# Patient Record
Sex: Male | Born: 1955 | Race: White | Hispanic: No | Marital: Single | Smoking: Never smoker
Health system: Southern US, Community
[De-identification: ages and names within clinical notes are randomized; demographics above are authoritative.]

## PROBLEM LIST (undated history)

## (undated) DIAGNOSIS — F1910 Other psychoactive substance abuse, uncomplicated: Secondary | ICD-10-CM

## (undated) DIAGNOSIS — K279 Peptic ulcer, site unspecified, unspecified as acute or chronic, without hemorrhage or perforation: Secondary | ICD-10-CM

## (undated) DIAGNOSIS — F1920 Other psychoactive substance dependence, uncomplicated: Secondary | ICD-10-CM

## (undated) DIAGNOSIS — L98499 Non-pressure chronic ulcer of skin of other sites with unspecified severity: Secondary | ICD-10-CM

---

## 2014-01-26 ENCOUNTER — Emergency Department: Payer: Self-pay | Admitting: Emergency Medicine

## 2014-01-26 LAB — CBC WITH DIFFERENTIAL/PLATELET
Basophil #: 0 10*3/uL (ref 0.0–0.1)
Basophil %: 0.7 %
Eosinophil #: 0.3 10*3/uL (ref 0.0–0.7)
Eosinophil %: 4.4 %
HCT: 40.8 % (ref 40.0–52.0)
HGB: 13.4 g/dL (ref 13.0–18.0)
Lymphocyte #: 1.8 10*3/uL (ref 1.0–3.6)
Lymphocyte %: 31.7 %
MCH: 31.6 pg (ref 26.0–34.0)
MCHC: 32.9 g/dL (ref 32.0–36.0)
MCV: 96 fL (ref 80–100)
Monocyte #: 0.8 x10 3/mm (ref 0.2–1.0)
Monocyte %: 14 %
NEUTROS ABS: 2.8 10*3/uL (ref 1.4–6.5)
NEUTROS PCT: 49.2 %
Platelet: 242 10*3/uL (ref 150–440)
RBC: 4.25 10*6/uL — ABNORMAL LOW (ref 4.40–5.90)
RDW: 15.6 % — ABNORMAL HIGH (ref 11.5–14.5)
WBC: 5.8 10*3/uL (ref 3.8–10.6)

## 2014-01-26 LAB — COMPREHENSIVE METABOLIC PANEL
ANION GAP: 8 (ref 7–16)
Albumin: 3.3 g/dL — ABNORMAL LOW (ref 3.4–5.0)
Alkaline Phosphatase: 52 U/L
BUN: 7 mg/dL (ref 7–18)
Bilirubin,Total: 0.3 mg/dL (ref 0.2–1.0)
CHLORIDE: 107 mmol/L (ref 98–107)
CO2: 24 mmol/L (ref 21–32)
Calcium, Total: 7.9 mg/dL — ABNORMAL LOW (ref 8.5–10.1)
Creatinine: 0.78 mg/dL (ref 0.60–1.30)
EGFR (African American): >60
EGFR (Non-African Amer.): >60
Glucose: 78 mg/dL (ref 65–99)
OSMOLALITY: 274 (ref 275–301)
POTASSIUM: 4.1 mmol/L (ref 3.5–5.1)
SGOT(AST): 17 U/L (ref 15–37)
SGPT (ALT): 22 U/L
Sodium: 139 mmol/L (ref 136–145)
Total Protein: 6.7 g/dL (ref 6.4–8.2)

## 2014-01-26 LAB — URINALYSIS, COMPLETE
BILIRUBIN, UR: NEGATIVE
Bacteria: NONE SEEN
Blood: NEGATIVE
Glucose,UR: NEGATIVE mg/dL (ref 0–75)
KETONE: NEGATIVE
LEUKOCYTE ESTERASE: NEGATIVE
Nitrite: NEGATIVE
Ph: 5 (ref 4.5–8.0)
Protein: NEGATIVE
RBC, UR: NONE SEEN /HPF (ref 0–5)
Specific Gravity: 1.008 (ref 1.003–1.030)
Squamous Epithelial: NONE SEEN
WBC UR: NONE SEEN /HPF (ref 0–5)

## 2014-01-26 LAB — TROPONIN I

## 2014-01-26 LAB — LIPASE, BLOOD: LIPASE: 369 U/L (ref 73–393)

## 2014-03-23 ENCOUNTER — Encounter (HOSPITAL_BASED_OUTPATIENT_CLINIC_OR_DEPARTMENT_OTHER): Payer: Self-pay | Admitting: *Deleted

## 2014-03-23 ENCOUNTER — Emergency Department (HOSPITAL_BASED_OUTPATIENT_CLINIC_OR_DEPARTMENT_OTHER)
Admission: EM | Admit: 2014-03-23 | Discharge: 2014-03-23 | Disposition: A | Discharge status: Home / Self Care | Payer: Medicaid Other | Attending: Emergency Medicine | Admitting: Emergency Medicine

## 2014-03-23 DIAGNOSIS — Z9114 Patient's other noncompliance with medication regimen: Secondary | ICD-10-CM | POA: Diagnosis not present

## 2014-03-23 DIAGNOSIS — Z872 Personal history of diseases of the skin and subcutaneous tissue: Secondary | ICD-10-CM | POA: Diagnosis not present

## 2014-03-23 DIAGNOSIS — R1013 Epigastric pain: Secondary | ICD-10-CM

## 2014-03-23 HISTORY — DX: Non-pressure chronic ulcer of skin of other sites with unspecified severity: L98.499

## 2014-03-23 LAB — COMPREHENSIVE METABOLIC PANEL
ALT: 19 U/L (ref 0–53)
ANION GAP: 7 (ref 5–15)
AST: 18 U/L (ref 0–37)
Albumin: 3.9 g/dL (ref 3.5–5.2)
Alkaline Phosphatase: 39 U/L (ref 39–117)
BILIRUBIN TOTAL: 0.5 mg/dL (ref 0.3–1.2)
BUN: 11 mg/dL (ref 6–23)
CO2: 27 mmol/L (ref 19–32)
Calcium: 8.9 mg/dL (ref 8.4–10.5)
Chloride: 104 mmol/L (ref 96–112)
Creatinine, Ser: 0.83 mg/dL (ref 0.50–1.35)
GFR calc Af Amer: >90 mL/min (ref >90)
GFR calc non Af Amer: >90 mL/min (ref >90)
GLUCOSE: 105 mg/dL — AB (ref 70–99)
POTASSIUM: 4.1 mmol/L (ref 3.5–5.1)
SODIUM: 138 mmol/L (ref 135–145)
TOTAL PROTEIN: 6.6 g/dL (ref 6.0–8.3)

## 2014-03-23 MED ORDER — CALCIUM CARBONATE ANTACID 600 MG PO CHEW: 600.0000 mg | CHEWABLE_TABLET | ORAL | Status: AC | PRN

## 2014-03-23 MED ORDER — GI COCKTAIL ~~LOC~~
30.0000 mL | Freq: Once | ORAL | Status: AC
  Administered 2014-03-23: 30 mL via ORAL
  Filled 2014-03-23: qty 30

## 2014-03-23 MED ORDER — RANITIDINE HCL 150 MG PO TABS: 150.0000 mg | ORAL_TABLET | Freq: Two times a day (BID) | ORAL | Status: AC

## 2014-03-23 NOTE — ED Notes (Signed)
Patient here with abdominal burning and reports that he has noticed some blood in his stools. Has not taken zantac the last several days for his "bleeding ulcers".

## 2014-03-23 NOTE — Discharge Instructions (Signed)
As discussed, your evaluation today has been largely reassuring.  But, it is important that you monitor your condition carefully, and do not hesitate to return to the ED if you develop new, or concerning changes in your condition.  The single most important thing you can do is to continue taking all medication as directed, and to avoid using alcohol or caffeine, or other food/drug that irritates your stomach.  Otherwise, please follow-up with your physician for appropriate ongoing care.

## 2014-03-23 NOTE — ED Provider Notes (Signed)
CSN: [id number]     Arrival date & time 03/23/14  1020 History   First MD Initiated Contact with Patient 03/23/14 1036     Chief Complaint  Patient presents with  . Abdominal Pain     (Consider location/radiation/quality/duration/timing/severity/associated sxs/prior Treatment) HPI Patient presents from his inpatient treatment facility with concern of ongoing epigastric discomfort. Patient states that the pain has been present for several days, not appreciably worse today than in the past few. Pain is epigastric, nonradiating, sore, severe, worse after drinking coffee or soda. Patient also complains of possible blood in stool. He denies any syncope, syncope, weakness, chest pain, dyspnea or other focal changes from baseline. Patient has previously diagnosed ulcers, has required surgery in the past.  Patient is not taking his prescribed H2 blocking medication. No alcohol intake in about 2 months.  Past Medical History  Diagnosis Date  . Ulcer of abdomen wall    History reviewed. No pertinent past surgical history. No family history on file. History  Substance Use Topics  . Smoking status: Never Smoker   . Smokeless tobacco: Not on file  . Alcohol Use: Not on file    Review of Systems  Constitutional:       Per HPI, otherwise negative  HENT:       Per HPI, otherwise negative  Respiratory:       Per HPI, otherwise negative  Cardiovascular:       Per HPI, otherwise negative  Gastrointestinal: Negative for vomiting.  Endocrine:       Negative aside from HPI  Genitourinary:       Neg aside from HPI   Musculoskeletal:       Per HPI, otherwise negative  Skin: Negative.   Neurological: Negative for syncope.      Allergies  Codeine  Home Medications   Prior to Admission medications   Medication Sig Start Date End Date Taking? Authorizing Provider  lithium carbonate 150 MG capsule Take 150 mg by mouth every morning.   Yes Historical Provider, MD   BP 109/75 mmHg   Pulse 81  Temp(Src) 98.4 F (36.9 C) (Oral)  Resp 20  Ht 6\' 1"  (1.854 m)  Wt 155 lb (70.308 kg)  BMI 20.45 kg/m2  SpO2 99% Physical Exam  Constitutional: He is oriented to person, place, and time. He appears well-developed. No distress.  HENT:  Head: Normocephalic and atraumatic.  Eyes: Conjunctivae and EOM are normal.  Cardiovascular: Normal rate and regular rhythm.   Pulmonary/Chest: Effort normal. No stridor. No respiratory distress.  Abdominal: He exhibits no distension.  Soft non-peritoneal abdomen, with only minimal discomfort with deep palpation of the epigastrium.  Musculoskeletal: He exhibits no edema.  Neurological: He is alert and oriented to person, place, and time.  Skin: Skin is warm and dry.  Psychiatric: He has a normal mood and affect.  Nursing note and vitals reviewed.   ED Course  Procedures (including critical care time) Labs Review Labs Reviewed  COMPREHENSIVE METABOLIC PANEL   I reviewed the labs  MDM   Patient presents with ongoing epigastric pain, medication noncompliance, and intake of multiple liquids all known to be irritants of the gastric lining. Patient is hemodynamically stable, has unremarkable labs. Patient was restarted on his upper medication for gastric irritation, and absent acute findings, discharged to follow up with gastrin neurology as an outpatient.    Gerhard Munchobert Jaquari Reckner, MD 03/23/14 1209

## 2016-11-02 ENCOUNTER — Encounter (HOSPITAL_COMMUNITY): Payer: Self-pay

## 2016-11-02 ENCOUNTER — Emergency Department (HOSPITAL_COMMUNITY)
Admission: EM | Admit: 2016-11-02 | Discharge: 2016-11-02 | Disposition: A | Discharge status: Home / Self Care | Payer: Medicaid Other | Attending: Emergency Medicine | Admitting: Emergency Medicine

## 2016-11-02 DIAGNOSIS — K920 Hematemesis: Secondary | ICD-10-CM | POA: Diagnosis not present

## 2016-11-02 DIAGNOSIS — R112 Nausea with vomiting, unspecified: Secondary | ICD-10-CM | POA: Insufficient documentation

## 2016-11-02 DIAGNOSIS — F1910 Other psychoactive substance abuse, uncomplicated: Secondary | ICD-10-CM | POA: Diagnosis not present

## 2016-11-02 DIAGNOSIS — R319 Hematuria, unspecified: Secondary | ICD-10-CM | POA: Diagnosis not present

## 2016-11-02 DIAGNOSIS — R45851 Suicidal ideations: Secondary | ICD-10-CM | POA: Diagnosis not present

## 2016-11-02 LAB — URINALYSIS, ROUTINE W REFLEX MICROSCOPIC
Bilirubin Urine: NEGATIVE
Glucose, UA: NEGATIVE mg/dL
KETONES UR: NEGATIVE mg/dL
LEUKOCYTES UA: NEGATIVE
Nitrite: NEGATIVE
PROTEIN: NEGATIVE mg/dL
RBC / HPF: NONE SEEN RBC/hpf (ref 0–5)
Specific Gravity, Urine: 1.01 (ref 1.005–1.030)
Squamous Epithelial / LPF: NONE SEEN
pH: 5 (ref 5.0–8.0)

## 2016-11-02 LAB — COMPREHENSIVE METABOLIC PANEL
ALT: 25 U/L (ref 17–63)
ANION GAP: 10 (ref 5–15)
AST: 34 U/L (ref 15–41)
Albumin: 4.2 g/dL (ref 3.5–5.0)
Alkaline Phosphatase: 53 U/L (ref 38–126)
BILIRUBIN TOTAL: 0.2 mg/dL — AB (ref 0.3–1.2)
BUN: 9 mg/dL (ref 6–20)
CO2: 25 mmol/L (ref 22–32)
Calcium: 8.8 mg/dL — ABNORMAL LOW (ref 8.9–10.3)
Chloride: 108 mmol/L (ref 101–111)
Creatinine, Ser: 0.83 mg/dL (ref 0.61–1.24)
GFR calc Af Amer: >60 mL/min (ref >60)
GFR calc non Af Amer: >60 mL/min (ref >60)
GLUCOSE: 101 mg/dL — AB (ref 65–99)
POTASSIUM: 4 mmol/L (ref 3.5–5.1)
Sodium: 143 mmol/L (ref 135–145)
TOTAL PROTEIN: 8.1 g/dL (ref 6.5–8.1)

## 2016-11-02 LAB — POC OCCULT BLOOD, ED: Fecal Occult Bld: NEGATIVE

## 2016-11-02 LAB — CBC
HEMATOCRIT: 42.3 % (ref 39.0–52.0)
HEMOGLOBIN: 14.8 g/dL (ref 13.0–17.0)
MCH: 31.6 pg (ref 26.0–34.0)
MCHC: 35 g/dL (ref 30.0–36.0)
MCV: 90.2 fL (ref 78.0–100.0)
Platelets: 381 10*3/uL (ref 150–400)
RBC: 4.69 MIL/uL (ref 4.22–5.81)
RDW: 14.4 % (ref 11.5–15.5)
WBC: 7.5 10*3/uL (ref 4.0–10.5)

## 2016-11-02 LAB — LIPASE, BLOOD: Lipase: 33 U/L (ref 11–51)

## 2016-11-02 LAB — PROTIME-INR
INR: 0.91
Prothrombin Time: 12.2 seconds (ref 11.4–15.2)

## 2016-11-02 MED ORDER — ONDANSETRON HCL 4 MG/2ML IJ SOLN
4.0000 mg | Freq: Once | INTRAMUSCULAR | Status: AC
  Administered 2016-11-02: 4 mg via INTRAVENOUS
  Filled 2016-11-02: qty 2

## 2016-11-02 MED ORDER — SODIUM CHLORIDE 0.9 % IV BOLUS (SEPSIS)
1000.0000 mL | Freq: Once | INTRAVENOUS | Status: AC
  Administered 2016-11-02: 1000 mL via INTRAVENOUS

## 2016-11-02 MED ORDER — ONDANSETRON 8 MG PO TBDP: 8.0000 mg | ORAL_TABLET | Freq: Three times a day (TID) | ORAL | 0 refills | Status: AC | PRN

## 2016-11-02 MED ORDER — PANTOPRAZOLE SODIUM 40 MG IV SOLR
40.0000 mg | Freq: Once | INTRAVENOUS | Status: AC
  Administered 2016-11-02: 40 mg via INTRAVENOUS
  Filled 2016-11-02: qty 40

## 2016-11-02 NOTE — ED Triage Notes (Signed)
Pt reports hematemesis, hematuria, and hematochezia since yesterday. Per EMS, pt is in a drug rehab called [facility]," when asked to elaborate, pt states, "I cannot read or write." He endorses smoking crack and drinking yesterday into today. Also endorses epigastric pain that he states, "feels like heartburn." No vomiting with EMS.

## 2016-11-02 NOTE — ED Provider Notes (Signed)
[facility]-EMERGENCY DEPT Provider Note   CSN: [id number] Arrival date & time: 11/02/16  1536     History   Chief Complaint Chief Complaint  Patient presents with  . Drug Problem  . Hematemesis  . Hematuria    HPI Donald Logan is a 61 y.o. male.  HPI Patient is a 61 year old male who presents emergency department reporting nausea vomiting.  He states there is a small amount of blood in his vomit.  The majority of his vomit is stomach content.  He denies blood in his stool.  Denies diarrhea.  Reports epigastric burning like pain.  Admits to drug use and alcohol use.  No history of cirrhosis.  No history of esophageal varices.  He states he's had a gastric bleed before in the past.  He reports this is many years ago in [facility]   Past Medical History:  Diagnosis Date  . Ulcer of abdomen wall (HCC)     There are no active problems to display for this patient.   History reviewed. No pertinent surgical history.     Home Medications    Prior to Admission medications   Medication Sig Start Date End Date Taking? Authorizing Provider  Aspirin-Acetaminophen-Caffeine (GOODY HEADACHE PO) Take 1 packet by mouth daily as needed (pain).   Yes [provider]  Calcium Carbonate Antacid (MAALOX) 600 MG chewable tablet Chew 1 tablet (600 mg total) by mouth every 4 (four) hours as needed for heartburn. Patient not taking: Reported on 11/02/2016 03/23/14   Gerhard Munch, MD  ondansetron (ZOFRAN ODT) 8 MG disintegrating tablet Take 1 tablet (8 mg total) by mouth every 8 (eight) hours as needed for nausea or vomiting. 11/02/16   Azalia Bilis, MD  ranitidine (ZANTAC) 150 MG tablet Take 1 tablet (150 mg total) by mouth 2 (two) times daily. Patient not taking: Reported on 11/02/2016 03/23/14   Gerhard Munch, MD    Family History History reviewed. No pertinent family history.  Social History Social History  Substance Use Topics  . Smoking status: Never Smoker  . Smokeless  tobacco: Not on file  . Alcohol use Not on file     Allergies   Codeine   Review of Systems Review of Systems  All other systems reviewed and are negative.    Physical Exam Updated Vital Signs BP (!) 135/93   Pulse 70   Temp 98.1 F (36.7 C) (Oral)   Resp (!) 24   Ht  (1.854 m)   Wt 49.9 kg (110 lb)   SpO2 96%   BMI 14.51 kg/m   Physical Exam  Constitutional: He is oriented to person, place, and time. He appears well-developed and well-nourished.  HENT:  Head: Normocephalic.  Eyes: EOM are normal.  Neck: Normal range of motion.  Cardiovascular: Normal rate.   Pulmonary/Chest: Effort normal.  Abdominal: Soft. He exhibits no distension.  Genitourinary:  Genitourinary Comments: Rectal exam demonstrates no mass.  Brown stool.  Hemoccult negative.  Musculoskeletal: Normal range of motion.  Neurological: He is alert and oriented to person, place, and time.  Psychiatric: He has a normal mood and affect.  Nursing note and vitals reviewed.    ED Treatments / Results  Labs (all labs ordered are listed, but only abnormal results are displayed) Labs Reviewed  COMPREHENSIVE METABOLIC PANEL - Abnormal; Notable for the following:       Result Value   Glucose, Bld 101 (*)    Calcium 8.8 (*)    Total Bilirubin 0.2 (*)  All other components within normal limits  URINALYSIS, ROUTINE W REFLEX MICROSCOPIC - Abnormal; Notable for the following:    Hgb urine dipstick SMALL (*)    Bacteria, UA RARE (*)    All other components within normal limits  LIPASE, BLOOD  CBC  PROTIME-INR  POC OCCULT BLOOD, ED    EKG  EKG Interpretation None       Radiology No results found.  Procedures Procedures (including critical care time)  Medications Ordered in ED Medications  pantoprazole (PROTONIX) injection 40 mg (40 mg Intravenous Given 11/02/16 2003)  ondansetron (ZOFRAN) injection 4 mg (4 mg Intravenous Given 11/02/16 2004)  sodium chloride 0.9 % bolus 1,000 mL  (1,000 mLs Intravenous New Bag/Given 11/02/16 1958)     Initial Impression / Assessment and Plan / ED Course  I have reviewed the triage vital signs and the nursing notes.  Pertinent labs & imaging results that were available during my care of the patient were reviewed by me and considered in my medical decision making (see chart for details).     No vomiting in the ER.  Hemoccult negative stool.  Hemoglobin nearly 15.  Vital signs stable.  Discharge home with Zofran.  Likely gastritis.  Recommended stopping to drink alcohol.  Final Clinical Impressions(s) / ED Diagnoses   Final diagnoses:  Nausea and vomiting, intractability of vomiting not specified, unspecified vomiting type    New Prescriptions New Prescriptions   ONDANSETRON (ZOFRAN ODT) 8 MG DISINTEGRATING TABLET    Take 1 tablet (8 mg total) by mouth every 8 (eight) hours as needed for nausea or vomiting.     Azalia Bilis, MD 11/02/16 2123

## 2016-11-03 ENCOUNTER — Encounter (HOSPITAL_COMMUNITY): Payer: Self-pay | Admitting: Emergency Medicine

## 2016-11-03 ENCOUNTER — Emergency Department (EMERGENCY_DEPARTMENT_HOSPITAL)
Admission: EM | Admit: 2016-11-03 | Discharge: 2016-11-03 | Disposition: A | Discharge status: Home / Self Care | Payer: Medicaid Other | Source: Home / Self Care | Attending: Emergency Medicine | Admitting: Emergency Medicine

## 2016-11-03 DIAGNOSIS — R45851 Suicidal ideations: Secondary | ICD-10-CM | POA: Diagnosis not present

## 2016-11-03 DIAGNOSIS — Z630 Problems in relationship with spouse or partner: Secondary | ICD-10-CM | POA: Diagnosis not present

## 2016-11-03 DIAGNOSIS — F1410 Cocaine abuse, uncomplicated: Secondary | ICD-10-CM

## 2016-11-03 DIAGNOSIS — F1910 Other psychoactive substance abuse, uncomplicated: Secondary | ICD-10-CM

## 2016-11-03 DIAGNOSIS — R4587 Impulsiveness: Secondary | ICD-10-CM | POA: Diagnosis not present

## 2016-11-03 DIAGNOSIS — R450 Nervousness: Secondary | ICD-10-CM | POA: Diagnosis not present

## 2016-11-03 DIAGNOSIS — F1994 Other psychoactive substance use, unspecified with psychoactive substance-induced mood disorder: Secondary | ICD-10-CM

## 2016-11-03 DIAGNOSIS — F332 Major depressive disorder, recurrent severe without psychotic features: Secondary | ICD-10-CM

## 2016-11-03 DIAGNOSIS — F419 Anxiety disorder, unspecified: Secondary | ICD-10-CM

## 2016-11-03 HISTORY — DX: Peptic ulcer, site unspecified, unspecified as acute or chronic, without hemorrhage or perforation: K27.9

## 2016-11-03 LAB — COMPREHENSIVE METABOLIC PANEL
ALT: 26 U/L (ref 17–63)
ANION GAP: 11 (ref 5–15)
AST: 43 U/L — ABNORMAL HIGH (ref 15–41)
Albumin: 4.1 g/dL (ref 3.5–5.0)
Alkaline Phosphatase: 52 U/L (ref 38–126)
BUN: 8 mg/dL (ref 6–20)
CALCIUM: 8.6 mg/dL — AB (ref 8.9–10.3)
CHLORIDE: 104 mmol/L (ref 101–111)
CO2: 23 mmol/L (ref 22–32)
Creatinine, Ser: 0.94 mg/dL (ref 0.61–1.24)
GFR calc Af Amer: >60 mL/min (ref >60)
Glucose, Bld: 91 mg/dL (ref 65–99)
Potassium: 4.2 mmol/L (ref 3.5–5.1)
SODIUM: 138 mmol/L (ref 135–145)
Total Bilirubin: 0.9 mg/dL (ref 0.3–1.2)
Total Protein: 7.4 g/dL (ref 6.5–8.1)

## 2016-11-03 LAB — RAPID URINE DRUG SCREEN, HOSP PERFORMED
Amphetamines: NOT DETECTED
Barbiturates: NOT DETECTED
Benzodiazepines: NOT DETECTED
Cocaine: POSITIVE — AB
OPIATES: NOT DETECTED
TETRAHYDROCANNABINOL: NOT DETECTED

## 2016-11-03 LAB — CBC
HCT: 42.1 % (ref 39.0–52.0)
HEMOGLOBIN: 14.1 g/dL (ref 13.0–17.0)
MCH: 30.4 pg (ref 26.0–34.0)
MCHC: 33.5 g/dL (ref 30.0–36.0)
MCV: 90.7 fL (ref 78.0–100.0)
Platelets: 382 10*3/uL (ref 150–400)
RBC: 4.64 MIL/uL (ref 4.22–5.81)
RDW: 14.8 % (ref 11.5–15.5)
WBC: 6.7 10*3/uL (ref 4.0–10.5)

## 2016-11-03 LAB — ETHANOL: Alcohol, Ethyl (B): 223 mg/dL — ABNORMAL HIGH (ref <10)

## 2016-11-03 LAB — SALICYLATE LEVEL: Salicylate Lvl: <7 mg/dL (ref 2.8–30.0)

## 2016-11-03 LAB — ACETAMINOPHEN LEVEL

## 2016-11-03 MED ORDER — ACETAMINOPHEN 325 MG PO TABS: 650.0000 mg | ORAL_TABLET | ORAL | Status: DC | PRN

## 2016-11-03 MED ORDER — NICOTINE 21 MG/24HR TD PT24
21.0000 mg | MEDICATED_PATCH | Freq: Every day | TRANSDERMAL | Status: DC
  Filled 2016-11-03: qty 1

## 2016-11-03 MED ORDER — LORAZEPAM 2 MG/ML IJ SOLN: 0.0000 mg | Freq: Two times a day (BID) | INTRAMUSCULAR | Status: DC

## 2016-11-03 MED ORDER — VITAMIN B-1 100 MG PO TABS
100.0000 mg | ORAL_TABLET | Freq: Every day | ORAL | Status: DC
  Administered 2016-11-03: 100 mg via ORAL
  Filled 2016-11-03: qty 1

## 2016-11-03 MED ORDER — LORAZEPAM 1 MG PO TABS
0.0000 mg | ORAL_TABLET | Freq: Four times a day (QID) | ORAL | Status: DC
  Administered 2016-11-03: 1 mg via ORAL
  Filled 2016-11-03: qty 1

## 2016-11-03 MED ORDER — LORAZEPAM 1 MG PO TABS
0.0000 mg | ORAL_TABLET | Freq: Two times a day (BID) | ORAL | Status: DC
Start: 2016-11-05 — End: 2016-11-03

## 2016-11-03 MED ORDER — ZOLPIDEM TARTRATE 5 MG PO TABS: 5.0000 mg | ORAL_TABLET | Freq: Every evening | ORAL | Status: DC | PRN

## 2016-11-03 MED ORDER — ALUM & MAG HYDROXIDE-SIMETH 200-200-20 MG/5ML PO SUSP: 30.0000 mL | Freq: Four times a day (QID) | ORAL | Status: DC | PRN

## 2016-11-03 MED ORDER — THIAMINE HCL 100 MG/ML IJ SOLN: 100.0000 mg | Freq: Every day | INTRAMUSCULAR | Status: DC

## 2016-11-03 MED ORDER — ONDANSETRON HCL 4 MG PO TABS
4.0000 mg | ORAL_TABLET | Freq: Three times a day (TID) | ORAL | Status: DC | PRN
  Administered 2016-11-03: 4 mg via ORAL
  Filled 2016-11-03: qty 1

## 2016-11-03 MED ORDER — LORAZEPAM 2 MG/ML IJ SOLN: 0.0000 mg | Freq: Four times a day (QID) | INTRAMUSCULAR | Status: DC

## 2016-11-03 NOTE — ED Provider Notes (Signed)
[facility]-EMERGENCY DEPT Provider Note   CSN: [id number] Arrival date & time: 11/02/16  2354     History   Chief Complaint Chief Complaint  Patient presents with  . Suicidal    HPI Donald Logan is a 61 y.o. male.  The history is provided by the patient and medical records.    61 year old male with history of peptic ulcers, presenting to the ED with suicidal ideation. Patient reports he is currently living in a "[place]".  States lately he has been using and abusing cocaine and alcohol on a nearly daily basis.  Last use earlier today.  States he has thought about overdosing several times but is not done this. He has not attempted to hurt himself in any way. No homicidal ideation. No hallucinations. States he feels very helpless as he has no family or other support system around.  Past Medical History:  Diagnosis Date  . Peptic ulcer   . Ulcer of abdomen wall (HCC)     There are no active problems to display for this patient.   History reviewed. No pertinent surgical history.     Home Medications    Prior to Admission medications   Medication Sig Start Date End Date Taking? Authorizing Provider  Aspirin-Acetaminophen-Caffeine (GOODY HEADACHE PO) Take 1 packet by mouth daily as needed (pain).    [provider]  Calcium Carbonate Antacid (MAALOX) 600 MG chewable tablet Chew 1 tablet (600 mg total) by mouth every 4 (four) hours as needed for heartburn. Patient not taking: Reported on 11/02/2016 03/23/14   Gerhard Munch, MD  ondansetron (ZOFRAN ODT) 8 MG disintegrating tablet Take 1 tablet (8 mg total) by mouth every 8 (eight) hours as needed for nausea or vomiting. 11/02/16   Azalia Bilis, MD  ranitidine (ZANTAC) 150 MG tablet Take 1 tablet (150 mg total) by mouth 2 (two) times daily. Patient not taking: Reported on 11/02/2016 03/23/14   Gerhard Munch, MD    Family History No family history on file.  Social History Social History  Substance Use Topics    . Smoking status: Never Smoker  . Smokeless tobacco: Never Used  . Alcohol use Yes     Allergies   Codeine   Review of Systems Review of Systems  Psychiatric/Behavioral: Positive for suicidal ideas.  All other systems reviewed and are negative.    Physical Exam Updated Vital Signs BP 121/83 (BP Location: Right Arm)   Pulse 89   Temp 98.4 F (36.9 C) (Oral)   Resp 18   SpO2 97%   Physical Exam  Constitutional: He is oriented to person, place, and time. He appears well-developed and well-nourished.  Breath smells of EtOH  HENT:  Head: Normocephalic and atraumatic.  Mouth/Throat: Oropharynx is clear and moist.  Eyes: Pupils are equal, round, and reactive to light. Conjunctivae and EOM are normal.  Neck: Normal range of motion.  Cardiovascular: Normal rate, regular rhythm and normal heart sounds.   Pulmonary/Chest: Effort normal and breath sounds normal. No respiratory distress. He has no wheezes.  Abdominal: Soft. Bowel sounds are normal. There is no tenderness. There is no rebound.  Musculoskeletal: Normal range of motion.  Neurological: He is alert and oriented to person, place, and time.  AAOx3, speech is clear and concise, no slurred speech, moving extremities well  Skin: Skin is warm and dry.  Psychiatric: He has a normal mood and affect. He is not actively hallucinating. He expresses suicidal ideation. He expresses no homicidal ideation. He expresses suicidal plans.  He expresses no homicidal plans.  Nursing note and vitals reviewed.    ED Treatments / Results  Labs (all labs ordered are listed, but only abnormal results are displayed) Labs Reviewed  COMPREHENSIVE METABOLIC PANEL - Abnormal; Notable for the following:       Result Value   Calcium 8.6 (*)    AST 43 (*)    All other components within normal limits  ETHANOL - Abnormal; Notable for the following:    Alcohol, Ethyl (B) 223 (*)    All other components within normal limits  ACETAMINOPHEN LEVEL -  Abnormal; Notable for the following:    Acetaminophen (Tylenol), Serum <10 (*)    All other components within normal limits  RAPID URINE DRUG SCREEN, HOSP PERFORMED - Abnormal; Notable for the following:    Cocaine POSITIVE (*)    All other components within normal limits  SALICYLATE LEVEL  CBC    EKG  EKG Interpretation None       Radiology No results found.  Procedures Procedures (including critical care time)  Medications Ordered in ED Medications - No data to display   Initial Impression / Assessment and Plan / ED Course  I have reviewed the triage vital signs and the nursing notes.  Pertinent labs & imaging results that were available during my care of the patient were reviewed by me and considered in my medical decision making (see chart for details).  61 y.o. M here with SI.  Reports thoughts of suicide by OD, however no attempts made.  Nearly daily use of cocaine and EtOH including today.  No physical complaints at this time.  Labs overall reassuring.  Ethanol 223, however patient conversant and does not clinically appear intoxicated.  UDS + for cocaine.  Medically cleared.  Will get TTS consult.  TTS has evaluated-- recommends to watch overnight, repeat assessment in the morning.  Patient remains stable here.  Holding orders in place, on CIWA protocol given his EtOH use.  Final Clinical Impressions(s) / ED Diagnoses   Final diagnoses:  Suicidal ideation  Polysubstance abuse [facility])    New Prescriptions New Prescriptions   No medications on file     Garlon Hatchet, PA-C 11/03/16 0431    Derwood Kaplan, MD 11/03/16 [phone]

## 2016-11-03 NOTE — Consult Note (Signed)
Telepsych Consultation   Reason for Consult:  Suicidal ideation Referring Physician:  Kathryne Hitch Location of Patient: [facility] Location of Provider: [facility]  Patient Identification: Donald Logan MRN:  [id number] Principal Diagnosis: Substance induced mood disorder [facility]) Diagnosis:   Patient Active Problem List   Diagnosis Date Noted  . Substance induced mood disorder ([place]) [F19.94] 11/03/2016  . Polysubstance abuse ([place]) [F19.10] 11/03/2016    Total Time spent with patient: 45 minutes  Subjective:   Donald Logan is a 61 y.o. male patient represents to [facility] being brought in by police after being found on a bridge.  HPI:  Donald Logan, 61 y.o., male patient seen by this provider on 11/03/16.  Chart reviewed and consulted with Dr. Dwyane Dee.  On evaluation Donald Logan reports that he had an argument with his girlfriend last night and was having suicidal thoughts.  States that he had done cocaine and drank alcohol and was feeling depressed, then started to have suicidal thoughts.  Reports that he does have depression and substance abuse issues but is working with outpatient substance abuse service called [facility]; "No they don't know that I'm still drinking or using cocaine."  States that he goes to classes 3-4 days a week.  Reports no prior psychiatric history other than substance abuse and depression.  At this time he denies suicidal/homicidal ideation, psychosis, and paranoia.  States that he was just intoxicated and upset yesterday and ready for discharged.   Discussed the importance of being truthful with outpatient provider and being cooperative/compliant.      Past Psychiatric History: Polysubstance abuse, Depression  Risk to Self: Suicidal Ideation: Yes-Currently Present Suicidal Intent: Yes-Currently Present Is patient at risk for suicide?: Yes Suicidal Plan?: Yes-Currently Present Specify Current Suicidal Plan: Pt. reports plan to jump from a  bridge. Access to Means: Yes Specify Access to Suicidal Means: Pt. reports intentions to utilize a bridge. What has been your use of drugs/alcohol within the last 12 months?: Alcohol and cocaine How many times?: 2 (Per patient, last attempt more than 2 years ago.) Other Self Harm Risks: None Triggers for Past Attempts: Unpredictable Intentional Self Injurious Behavior: None Risk to Others: Homicidal Ideation: No (Patient denies.) Thoughts of Harm to Others: No Current Homicidal Intent: No Current Homicidal Plan: No Access to Homicidal Means: No Identified Victim: Patient denies. History of harm to others?: No Assessment of Violence: None Noted Violent Behavior Description: Patient denies. Does patient have access to weapons?: No Criminal Charges Pending?: No Does patient have a court date: No Prior Inpatient Therapy: Prior Inpatient Therapy: Yes Prior Therapy Dates: Unknown Prior Therapy Facilty/Provider(s): [facility] Reason for Treatment: Pt. reports substance use Prior Outpatient Therapy: Prior Outpatient Therapy: No Prior Therapy Dates: Pt. reports none Prior Therapy Facilty/Provider(s): None Reason for Treatment: None Does patient have an ACCT team?: No Does patient have Intensive In-House Services?  : No Does patient have [facility] services? : No Does patient have P4CC services?: No  Past Medical History:  Past Medical History:  Diagnosis Date  . Peptic ulcer   . Ulcer of abdomen wall ([place])    History reviewed. No pertinent surgical history. Family History: History reviewed. No pertinent family history. Family Psychiatric  History: Denies Social History:  History  Alcohol Use  . Yes     History  Drug Use  . Types: Cocaine    Social History   Social History  . Marital status: Single    Spouse name: N/A  .  Number of children: N/A  . Years of education: N/A   Social History Main Topics  . Smoking status: Never Smoker  . Smokeless tobacco: Never Used  .  Alcohol use Yes  . Drug use: Yes    Types: Cocaine  . Sexual activity: Not Asked   Other Topics Concern  . None   Social History Narrative  . None   Additional Social History:    Allergies:   Allergies  Allergen Reactions  . Codeine Nausea And Vomiting    Labs:  Results for orders placed or performed during the hospital encounter of 11/03/16 (from the past 48 hour(s))  Rapid urine drug screen (hospital performed)     Status: Abnormal   Collection Time: 11/03/16 12:12 AM  Result Value Ref Range   Opiates NONE DETECTED NONE DETECTED   Cocaine POSITIVE (A) NONE DETECTED   Benzodiazepines NONE DETECTED NONE DETECTED   Amphetamines NONE DETECTED NONE DETECTED   Tetrahydrocannabinol NONE DETECTED NONE DETECTED   Barbiturates NONE DETECTED NONE DETECTED    Comment:        DRUG SCREEN FOR MEDICAL PURPOSES ONLY.  IF CONFIRMATION IS NEEDED FOR ANY PURPOSE, NOTIFY LAB WITHIN 5 DAYS.        LOWEST DETECTABLE LIMITS FOR URINE DRUG SCREEN Drug Class       Cutoff (ng/mL) Amphetamine      1000 Barbiturate      200 Benzodiazepine   003 Tricyclics       491 Opiates          300 Cocaine          300 THC              50   Comprehensive metabolic panel     Status: Abnormal   Collection Time: 11/03/16 12:27 AM  Result Value Ref Range   Sodium 138 135 - 145 mmol/L   Potassium 4.2 3.5 - 5.1 mmol/L    Comment: SLIGHT HEMOLYSIS   Chloride 104 101 - 111 mmol/L   CO2 23 22 - 32 mmol/L   Glucose, Bld 91 65 - 99 mg/dL   BUN 8 6 - 20 mg/dL   Creatinine, Ser 0.94 0.61 - 1.24 mg/dL   Calcium 8.6 (L) 8.9 - 10.3 mg/dL   Total Protein 7.4 6.5 - 8.1 g/dL   Albumin 4.1 3.5 - 5.0 g/dL   AST 43 (H) 15 - 41 U/L   ALT 26 17 - 63 U/L   Alkaline Phosphatase 52 38 - 126 U/L   Total Bilirubin 0.9 0.3 - 1.2 mg/dL   GFR calc non Af Amer >60 >60 mL/min   GFR calc Af Amer >60 >60 mL/min    Comment: (NOTE) The eGFR has been calculated using the CKD EPI equation. This calculation has not been  validated in all clinical situations. eGFR's persistently <60 mL/min signify possible Chronic Kidney Disease.    Anion gap 11 5 - 15  Ethanol     Status: Abnormal   Collection Time: 11/03/16 12:27 AM  Result Value Ref Range   Alcohol, Ethyl (B) 223 (H) <10 mg/dL    Comment:        LOWEST DETECTABLE LIMIT FOR SERUM ALCOHOL IS 10 mg/dL FOR MEDICAL PURPOSES ONLY Please note change in reference range.   Salicylate level     Status: None   Collection Time: 11/03/16 12:27 AM  Result Value Ref Range   Salicylate Lvl <7.9 2.8 - 30.0 mg/dL  Acetaminophen level  Status: Abnormal   Collection Time: 11/03/16 12:27 AM  Result Value Ref Range   Acetaminophen (Tylenol), Serum <10 (L) 10 - 30 ug/mL    Comment:        THERAPEUTIC CONCENTRATIONS VARY SIGNIFICANTLY. A RANGE OF 10-30 ug/mL MAY BE AN EFFECTIVE CONCENTRATION FOR MANY PATIENTS. HOWEVER, SOME ARE BEST TREATED AT CONCENTRATIONS OUTSIDE THIS RANGE. ACETAMINOPHEN CONCENTRATIONS >150 ug/mL AT 4 HOURS AFTER INGESTION AND >50 ug/mL AT 12 HOURS AFTER INGESTION ARE OFTEN ASSOCIATED WITH TOXIC REACTIONS.   cbc     Status: None   Collection Time: 11/03/16 12:27 AM  Result Value Ref Range   WBC 6.7 4.0 - 10.5 K/uL   RBC 4.64 4.22 - 5.81 MIL/uL   Hemoglobin 14.1 13.0 - 17.0 g/dL   HCT 42.1 39.0 - 52.0 %   MCV 90.7 78.0 - 100.0 fL   MCH 30.4 26.0 - 34.0 pg   MCHC 33.5 30.0 - 36.0 g/dL   RDW 14.8 11.5 - 15.5 %   Platelets 382 150 - 400 K/uL    Medications:  Current Facility-Administered Medications  Medication Dose Route Frequency Provider Last Rate Last Dose  . acetaminophen (TYLENOL) tablet 650 mg  650 mg Oral Q4H PRN Larene Pickett, PA-C      . alum & mag hydroxide-simeth (MAALOX/MYLANTA) 200-200-20 MG/5ML suspension 30 mL  30 mL Oral Q6H PRN Larene Pickett, PA-C      . LORazepam (ATIVAN) injection 0-4 mg  0-4 mg Intravenous Q6H Larene Pickett, PA-C       Or  . LORazepam (ATIVAN) tablet 0-4 mg  0-4 mg Oral Q6H Larene Pickett, PA-C   1 mg at 11/03/16 0948  . [START ON 11/05/2016] LORazepam (ATIVAN) injection 0-4 mg  0-4 mg Intravenous Q12H Larene Pickett, PA-C       Or  . Derrill Memo ON 11/05/2016] LORazepam (ATIVAN) tablet 0-4 mg  0-4 mg Oral Q12H Larene Pickett, PA-C      . nicotine (NICODERM CQ - dosed in mg/24 hours) patch 21 mg  21 mg Transdermal Daily Quincy Carnes M, PA-C      . ondansetron [facility]) tablet 4 mg  4 mg Oral Q8H PRN Larene Pickett, PA-C   4 mg at 11/03/16 0948  . thiamine (VITAMIN B-1) tablet 100 mg  100 mg Oral Daily Larene Pickett, PA-C   100 mg at 11/03/16 3474  . zolpidem (AMBIEN) tablet 5 mg  5 mg Oral QHS PRN Larene Pickett, PA-C       Current Outpatient Prescriptions  Medication Sig Dispense Refill  . Aspirin-Acetaminophen-Caffeine (GOODY HEADACHE PO) Take 1 packet by mouth daily as needed (pain).    . ondansetron (ZOFRAN ODT) 8 MG disintegrating tablet Take 1 tablet (8 mg total) by mouth every 8 (eight) hours as needed for nausea or vomiting. 10 tablet 0  . Calcium Carbonate Antacid (MAALOX) 600 MG chewable tablet Chew 1 tablet (600 mg total) by mouth every 4 (four) hours as needed for heartburn. (Patient not taking: Reported on 11/02/2016) 90 tablet 0  . ranitidine (ZANTAC) 150 MG tablet Take 1 tablet (150 mg total) by mouth 2 (two) times daily. (Patient not taking: Reported on 11/02/2016) 60 tablet 0    Musculoskeletal: Strength & Muscle Tone: within normal limits Gait & Station: normal Patient leans: N/A  Psychiatric Specialty Exam: Physical Exam  Constitutional: He is oriented to person, place, and time.  Neck: Normal range of motion.  Respiratory: Effort normal.  Musculoskeletal: Normal range of motion.  Neurological: He is alert and oriented to person, place, and time.  Psychiatric: His behavior is normal. Thought content normal. Cognition and memory are normal. He expresses impulsivity. He exhibits a depressed mood. Suicidal: denies.    Review of Systems   Psychiatric/Behavioral: Positive for depression (Stable) and substance abuse (Cocaine, ETOH). Negative for memory loss. Hallucinations: Denies  Suicidal ideas: Denies. The patient is nervous/anxious (Stable ). Insomnia: Denies      Blood pressure (!) 160/87, pulse 84, temperature 98.5 F (36.9 C), temperature source Oral, resp. rate 16, SpO2 96 %.There is no height or weight on file to calculate BMI.  General Appearance: Disheveled  Eye Contact:  Good  Speech:  Clear and Coherent and Normal Rate  Volume:  Normal  Mood:  "Good"  Affect:  Appropriate and Congruent  Thought Process:  Coherent and Goal Directed  Orientation:  Full (Time, Place, and Person)  Thought Content:  Denies hallucinations, delusions, and paranoia  Suicidal Thoughts:  No; Denies at this time; states only intoxicated and angry last night  Homicidal Thoughts:  No  Memory:  Immediate;   Fair Recent;   Fair Remote;   Fair  Judgement:  Fair  Insight:  Fair  Psychomotor Activity:  Normal  Concentration:  Concentration: Fair and Attention Span: Fair  Recall:  [employer] of Knowledge:  Fair  Language:  Good  Akathisia:  No  Handed:  Right  AIMS (if indicated):     Assets:  Communication Skills Desire for Improvement  ADL's:  Intact  Cognition:  WNL  Sleep:        Treatment Plan Summary: Plan Discharge with resources  Disposition: No evidence of imminent risk to self or others at present.   Patient does not meet criteria for psychiatric inpatient admission. Referral information to [facility] (medication management, therapy, housing)  This service was provided via telemedicine using a 2-way, interactive audio and video technology.  Names of all persons participating in this telemedicine service and their role in this encounter. Name: Earleen Newport NP Role: Telepsych  Name: Dr Dwyane Dee Role: Psychiatrist  Name:  Role:   Name:  Role:     Earleen Newport, NP 11/03/2016 12:18 PM

## 2016-11-03 NOTE — Discharge Instructions (Signed)
Avoid using drugs.   See your doctor  Return to ER if you have thoughts of harming yourself or others, hallucinations, withdrawals

## 2016-11-03 NOTE — ED Triage Notes (Signed)
Pt states he is SI, does not have an exact plan. States he has been feeling this way for some time. Pt states he is intoxicated and last used crack cocaine last night.

## 2016-11-03 NOTE — Progress Notes (Signed)
CSW spoke with Baldo Daub [facility] Disposition CSW. CSW was informed by Kathrin Greathouse that pt would be needing bus pass to get to and from agency ([facility]). CSW provided pt's nurse with bus pass and informed pt of what next steps were. Pt appeared to be understanding ad have no further questions at this time. There are no further CSW interventions needed at this time. CSW signing off. Please re consult as needed.      Claude Manges Kimberley Speece, MSW, LCSW-A Emergency Department Clinical Social Worker [phone]

## 2016-11-03 NOTE — BHH Counselor (Signed)
Per Donell Sievert, PA-C: Recommendation for continual monitoring of Patient for safety and stabilization per psychiatric in AM.   Attending [facility], Sharilyn Sites, PA-C, provider notified at [phone].

## 2016-11-03 NOTE — ED Notes (Signed)
TTS being completed. 

## 2016-11-03 NOTE — ED Notes (Signed)
Belongings removed, pt wanded by security.  ?

## 2016-11-03 NOTE — ED Notes (Signed)
Belongings given back to patient.  °

## 2016-11-03 NOTE — ED Provider Notes (Signed)
  Physical Exam  BP (!) 160/87   Pulse 84   Temp 98.5 F (36.9 C) (Oral)   Resp 16   SpO2 96%   Physical Exam  ED Course  Procedures  MDM Patient seen by psych, cleared for discharge. Stable for dc      Charlynne Pander, MD 11/03/16 1228

## 2016-11-03 NOTE — BH Assessment (Signed)
Tele Assessment Note   Patient Name: Donald Logan MRN: [id number] Referring Physician: Sharilyn Sites, PA-C Location of Patient: [facility] Location of Provider: Behavioral Health TTS Department  Donald Logan is an 61 y.o.divorced male, who voluntarily came into [facility]. Patient reported being in a rehab program in [place], [place], however being from [place].  Patient stated being unfamiliar with the area and unsure of the name of rehab program. Patient reported that he was seen at [facility] ED, for health concerns, and discharged.  Patient stated that after discharge he began walking and was unsure of the address of the rehab program.   Patient stated the then became afraid and walked to [facility] ED.   Patient reported having suicidal ideations, due to the triggers from his fear, and having a plan to jump from a bridge.  Patient reported daily use of between 3 and 4 quarts of alcohol on a daily basis and use of $100 of cocaine at various times.  Patient stated experiencing auditory hallucinations of voices and visual hallucinations of knives.  Patient reported ongoing experiences with depressive symptoms, such as fatigue, isolation and anger.   Patient denies homicidal ideations, self-injurious behaviors, or access to weapons.    Patient reported currently residing at a hotel provide by the rehab program where he receives services.  Patient stated that he has been staying in the hotel for approximately 9 months. Patient reported receiving social security benefits.  Patient identified recent stressors associated with his health.  Patient stated receiving inpatient treatment for at [facility], for substance use, however being unsure of the date.   Patient stated that he receives no other outpatient services at the time.    During assessment, Patient was Patient was dressed in scrubs and laying in his bed.  Patient was oriented to person, location, time, and situation.  Patient's eye contact  was fair.  Patient's motor activity consisted of freedom of movement.  Patient's speech was logical, coherent, slurred, and aggressive.  Patient's level of consciousness was alert and combative.   Patient's mood and affect appeared to be irritable and anxious.  Patient's thought process was coherent and relevant.  Patient's judgment appeared to be unimpaired.    Diagnosis: Substance Induced Mood Disorder Alcohol Use Disorder Cocaine Use Disorder   Past Medical History:  Past Medical History:  Diagnosis Date  . Peptic ulcer   . Ulcer of abdomen wall ([facility])     History reviewed. No pertinent surgical history.  Family History: No family history on file.  Social History:  reports that he has never smoked. He has never used smokeless tobacco. He reports that he drinks alcohol. He reports that he uses drugs, including Cocaine.  Additional Social History:  Alcohol / Drug Use Pain Medications: See MAR Prescriptions: See MAR Over the Counter: See MAR History of alcohol / drug use?: Yes Longest period of sobriety (when/how long): Unknown Substance #1 Name of Substance 1: Alcohol 1 - Age of First Use: 15 1 - Amount (size/oz): 3-4 quarts 1 - Frequency: Daily 1 - Duration: Ongoing 1 - Last Use / Amount: 11/02/2016 Substance #2 Name of Substance 2: Cocaine 2 - Age of First Use: 36 2 - Amount (size/oz): $100 2 - Frequency: Unknown 2 - Duration: Ongoing 2 - Last Use / Amount: 11/02/2016  CIWA: CIWA-Ar BP: 121/83 Pulse Rate: 89 COWS:    PATIENT STRENGTHS: (choose at least two) Ability for insight Average or above average intelligence Communication skills General fund  of knowledge Motivation for treatment/growth Supportive family/friends  Allergies:  Allergies  Allergen Reactions  . Codeine Nausea And Vomiting    Home Medications:  (Not in a hospital admission)  OB/GYN Status:  No LMP for male patient.  General Assessment Data Location of Assessment: [facility] ED TTS  Assessment: In system Is this a Tele or Face-to-Face Assessment?: Tele Assessment Is this an Initial Assessment or a Re-assessment for this encounter?: Initial Assessment Marital status: Divorced Is patient pregnant?: No Pregnancy Status: No Living Arrangements: Other (Comment) (Reports placement in hotel due to being in a rehab program.) Can pt return to current living arrangement?: Yes Admission Status: Voluntary Is patient capable of signing voluntary admission?: Yes Referral Source: Self/Family/Friend Insurance type: Medicaid     Crisis Care Plan Living Arrangements: Other (Comment) (Reports placement in hotel due to being in a rehab program.) Legal Guardian: Other: (Self) Name of Psychiatrist: None Name of Therapist: None  Education Status Is patient currently in school?: No Current Grade: N/A Highest grade of school patient has completed: 9th Name of school: N/A Contact person: N/A  Risk to self with the past 6 months Suicidal Ideation: Yes-Currently Present Has patient been a risk to self within the past 6 months prior to admission? : No Suicidal Intent: Yes-Currently Present Has patient had any suicidal intent within the past 6 months prior to admission? : No Is patient at risk for suicide?: Yes Suicidal Plan?: Yes-Currently Present Has patient had any suicidal plan within the past 6 months prior to admission? : No Specify Current Suicidal Plan: Pt. reports plan to jump from a bridge. Access to Means: Yes Specify Access to Suicidal Means: Pt. reports intentions to utilize a bridge. What has been your use of drugs/alcohol within the last 12 months?: Alcohol and cocaine Previous Attempts/Gestures: Yes How many times?: 2 (Per patient, last attempt more than 2 years ago.) Other Self Harm Risks: None Triggers for Past Attempts: Unpredictable Intentional Self Injurious Behavior: None Family Suicide History: No Recent stressful life event(s): Other (Comment) (Pt. report  health concerns) Persecutory voices/beliefs?: No Depression: Yes Depression Symptoms: Fatigue, Isolating, Feeling angry/irritable Substance abuse history and/or treatment for substance abuse?: Yes Suicide prevention information given to non-admitted patients: Not applicable  Risk to Others within the past 6 months Homicidal Ideation: No (Patient denies.) Does patient have any lifetime risk of violence toward others beyond the six months prior to admission? : No Thoughts of Harm to Others: No Current Homicidal Intent: No Current Homicidal Plan: No Access to Homicidal Means: No Identified Victim: Patient denies. History of harm to others?: No Assessment of Violence: None Noted Violent Behavior Description: Patient denies. Does patient have access to weapons?: No Criminal Charges Pending?: No Does patient have a court date: No Is patient on probation?: No  Psychosis Hallucinations: Auditory, Visual Delusions: None noted  Mental Status Report Appearance/Hygiene: In scrubs Eye Contact: Fair Motor Activity: Freedom of movement Speech: Logical/coherent, Slurred, Aggressive Level of Consciousness: Alert, Combative Mood: Irritable, Anxious Affect: Irritable, Anxious Anxiety Level: Minimal Thought Processes: Coherent, Relevant Judgement: Unimpaired Orientation: Person, Place, Time, Situation Obsessive Compulsive Thoughts/Behaviors: None  Cognitive Functioning Concentration: Fair Memory: Recent Intact, Remote Intact IQ: Average Insight: Fair Impulse Control: Poor Appetite: Poor Weight Loss: 0 Weight Gain: 0 Sleep: Decreased Total Hours of Sleep: 5 Vegetative Symptoms: None  ADLScreening [facility] Assessment Services) Patient's cognitive ability adequate to safely complete daily activities?: Yes Patient able to express need for assistance with ADLs?: Yes Independently performs ADLs?: Yes (appropriate for developmental  age)  Prior Inpatient Therapy Prior Inpatient Therapy:  Yes Prior Therapy Dates: Unknown Prior Therapy Facilty/Provider(s): [facility] Reason for Treatment: Pt. reports substance use  Prior Outpatient Therapy Prior Outpatient Therapy: No Prior Therapy Dates: Pt. reports none Prior Therapy Facilty/Provider(s): None Reason for Treatment: None Does patient have an ACCT team?: No Does patient have Intensive In-House Services?  : No Does patient have [facility] services? : No Does patient have P4CC services?: No  ADL Screening (condition at time of admission) Patient's cognitive ability adequate to safely complete daily activities?: Yes Is the patient deaf or have difficulty hearing?: No Does the patient have difficulty seeing, even when wearing glasses/contacts?: No Does the patient have difficulty concentrating, remembering, or making decisions?: No Patient able to express need for assistance with ADLs?: Yes Does the patient have difficulty dressing or bathing?: No Independently performs ADLs?: Yes (appropriate for developmental age) Does the patient have difficulty walking or climbing stairs?: No Weakness of Legs: None Weakness of Arms/Hands: None  Home Assistive Devices/Equipment Home Assistive Devices/Equipment: None    Abuse/Neglect Assessment (Assessment to be complete while patient is alone) Physical Abuse: Denies Verbal Abuse: Denies Sexual Abuse: Denies Exploitation of patient/patient's resources: Denies Self-Neglect: Denies     Merchant navy officer (For Healthcare) Does Patient Have a Medical Advance Directive?: No Would patient like information on creating a medical advance directive?: No - Patient declined    Additional Information 1:1 In Past 12 Months?: No CIRT Risk: No Elopement Risk: No Does patient have medical clearance?: Yes     Disposition:  Disposition Initial Assessment Completed for this Encounter: Yes Disposition of Patient: Other dispositions (Per Donell Sievert, PA-C) Other disposition(s): Other  (Comment) (Continual observation for safety and stabilization)  This service was provided via telemedicine using a 2-way, interactive audio and video technology.    Talbert Nan 11/03/2016 2:37 AM

## 2016-11-03 NOTE — ED Triage Notes (Signed)
Staffing called, no sitters at this time.

## 2016-11-03 NOTE — Progress Notes (Signed)
Per Dr. Lucianne Muss, the patient does not meet criteria for inpatient treatment. The patient is recommended for discharge and to follow up with [facility] at discharge. Patient is Psych Cleared.   The patient has a follow up appointment with [facility] on 11/04/16 at 1:00pm.    Patient also advised to contact [facility] Housing Coordinator, [place] [phone]) to discuss possible housing/ residential resources.     Patient requesting resources for transportation. Disposition CSW notified ED CSW, Hortencia Pilar [phone]).   Misty Stanley, RN notified.    Baldo Daub MSW, LCSWA CSW Disposition [phone]

## 2016-11-03 NOTE — ED Notes (Signed)
TTS completed. 

## 2016-11-04 ENCOUNTER — Emergency Department (HOSPITAL_COMMUNITY)
Admission: EM | Admit: 2016-11-04 | Discharge: 2016-11-04 | Disposition: A | Discharge status: Home / Self Care | Payer: Medicaid Other | Attending: Emergency Medicine | Admitting: Emergency Medicine

## 2016-11-04 ENCOUNTER — Encounter (HOSPITAL_COMMUNITY): Payer: Self-pay | Admitting: Nurse Practitioner

## 2016-11-04 ENCOUNTER — Emergency Department (HOSPITAL_COMMUNITY): Payer: Medicaid Other

## 2016-11-04 DIAGNOSIS — F1994 Other psychoactive substance use, unspecified with psychoactive substance-induced mood disorder: Secondary | ICD-10-CM

## 2016-11-04 DIAGNOSIS — Z008 Encounter for other general examination: Secondary | ICD-10-CM

## 2016-11-04 DIAGNOSIS — F1910 Other psychoactive substance abuse, uncomplicated: Secondary | ICD-10-CM | POA: Insufficient documentation

## 2016-11-04 DIAGNOSIS — K2920 Alcoholic gastritis without bleeding: Secondary | ICD-10-CM

## 2016-11-04 DIAGNOSIS — F1010 Alcohol abuse, uncomplicated: Secondary | ICD-10-CM | POA: Diagnosis not present

## 2016-11-04 DIAGNOSIS — R1013 Epigastric pain: Secondary | ICD-10-CM | POA: Diagnosis not present

## 2016-11-04 DIAGNOSIS — R112 Nausea with vomiting, unspecified: Secondary | ICD-10-CM

## 2016-11-04 DIAGNOSIS — Z79899 Other long term (current) drug therapy: Secondary | ICD-10-CM | POA: Diagnosis not present

## 2016-11-04 DIAGNOSIS — R0789 Other chest pain: Secondary | ICD-10-CM | POA: Insufficient documentation

## 2016-11-04 DIAGNOSIS — R45851 Suicidal ideations: Secondary | ICD-10-CM | POA: Insufficient documentation

## 2016-11-04 HISTORY — DX: Other psychoactive substance abuse, uncomplicated: F19.10

## 2016-11-04 LAB — COMPREHENSIVE METABOLIC PANEL
ALBUMIN: 3.8 g/dL (ref 3.5–5.0)
ALK PHOS: 49 U/L (ref 38–126)
ALT: 33 U/L (ref 17–63)
ANION GAP: 8 (ref 5–15)
AST: 43 U/L — ABNORMAL HIGH (ref 15–41)
BILIRUBIN TOTAL: 0.6 mg/dL (ref 0.3–1.2)
BUN: 5 mg/dL — ABNORMAL LOW (ref 6–20)
CALCIUM: 8.6 mg/dL — AB (ref 8.9–10.3)
CO2: 24 mmol/L (ref 22–32)
Chloride: 103 mmol/L (ref 101–111)
Creatinine, Ser: 0.79 mg/dL (ref 0.61–1.24)
GLUCOSE: 97 mg/dL (ref 65–99)
POTASSIUM: 3.9 mmol/L (ref 3.5–5.1)
Sodium: 135 mmol/L (ref 135–145)
TOTAL PROTEIN: 7 g/dL (ref 6.5–8.1)

## 2016-11-04 LAB — URINALYSIS, ROUTINE W REFLEX MICROSCOPIC
BILIRUBIN URINE: NEGATIVE
Glucose, UA: NEGATIVE mg/dL
HGB URINE DIPSTICK: NEGATIVE
Ketones, ur: NEGATIVE mg/dL
Leukocytes, UA: NEGATIVE
NITRITE: NEGATIVE
PROTEIN: NEGATIVE mg/dL
Specific Gravity, Urine: 1.003 — ABNORMAL LOW (ref 1.005–1.030)
pH: 6 (ref 5.0–8.0)

## 2016-11-04 LAB — RAPID URINE DRUG SCREEN, HOSP PERFORMED
AMPHETAMINES: NOT DETECTED
Barbiturates: NOT DETECTED
Benzodiazepines: NOT DETECTED
Cocaine: NOT DETECTED
Opiates: NOT DETECTED
Tetrahydrocannabinol: NOT DETECTED

## 2016-11-04 LAB — I-STAT TROPONIN, ED
TROPONIN I, POC: 0 ng/mL (ref 0.00–0.08)
Troponin i, poc: 0.01 ng/mL (ref 0.00–0.08)

## 2016-11-04 LAB — CBC
HEMATOCRIT: 38 % — AB (ref 39.0–52.0)
HEMOGLOBIN: 13 g/dL (ref 13.0–17.0)
MCH: 31 pg (ref 26.0–34.0)
MCHC: 34.2 g/dL (ref 30.0–36.0)
MCV: 90.5 fL (ref 78.0–100.0)
Platelets: 340 10*3/uL (ref 150–400)
RBC: 4.2 MIL/uL — AB (ref 4.22–5.81)
RDW: 14.1 % (ref 11.5–15.5)
WBC: 5.9 10*3/uL (ref 4.0–10.5)

## 2016-11-04 LAB — ACETAMINOPHEN LEVEL

## 2016-11-04 LAB — SALICYLATE LEVEL: Salicylate Lvl: <7 mg/dL (ref 2.8–30.0)

## 2016-11-04 LAB — LIPASE, BLOOD: LIPASE: 35 U/L (ref 11–51)

## 2016-11-04 LAB — ETHANOL: ALCOHOL ETHYL (B): 307 mg/dL — AB (ref <10)

## 2016-11-04 MED ORDER — FAMOTIDINE IN NACL 20-0.9 MG/50ML-% IV SOLN
20.0000 mg | Freq: Once | INTRAVENOUS | Status: AC
  Administered 2016-11-04: 20 mg via INTRAVENOUS
  Filled 2016-11-04: qty 50

## 2016-11-04 MED ORDER — ZOLPIDEM TARTRATE 5 MG PO TABS: 5.0000 mg | ORAL_TABLET | Freq: Every evening | ORAL | Status: DC | PRN

## 2016-11-04 MED ORDER — SODIUM CHLORIDE 0.9 % IV BOLUS (SEPSIS)
1000.0000 mL | Freq: Once | INTRAVENOUS | Status: AC
  Administered 2016-11-04: 1000 mL via INTRAVENOUS

## 2016-11-04 MED ORDER — LORAZEPAM 2 MG/ML IJ SOLN: 0.0000 mg | Freq: Two times a day (BID) | INTRAMUSCULAR | Status: DC

## 2016-11-04 MED ORDER — VITAMIN B-1 100 MG PO TABS: 100.0000 mg | ORAL_TABLET | Freq: Every day | ORAL | Status: DC

## 2016-11-04 MED ORDER — LORAZEPAM 1 MG PO TABS
0.0000 mg | ORAL_TABLET | Freq: Four times a day (QID) | ORAL | Status: DC
Start: 2016-11-04 — End: 2016-11-04

## 2016-11-04 MED ORDER — ONDANSETRON HCL 4 MG/2ML IJ SOLN
4.0000 mg | Freq: Once | INTRAMUSCULAR | Status: AC
  Administered 2016-11-04: 4 mg via INTRAVENOUS
  Filled 2016-11-04: qty 2

## 2016-11-04 MED ORDER — ONDANSETRON HCL 4 MG PO TABS: 4.0000 mg | ORAL_TABLET | Freq: Three times a day (TID) | ORAL | Status: DC | PRN

## 2016-11-04 MED ORDER — LORAZEPAM 1 MG PO TABS: 0.0000 mg | ORAL_TABLET | Freq: Two times a day (BID) | ORAL | Status: DC

## 2016-11-04 MED ORDER — THIAMINE HCL 100 MG/ML IJ SOLN: 100.0000 mg | Freq: Every day | INTRAMUSCULAR | Status: DC

## 2016-11-04 MED ORDER — FAMOTIDINE 20 MG PO TABS: 20.0000 mg | ORAL_TABLET | Freq: Two times a day (BID) | ORAL | Status: DC

## 2016-11-04 MED ORDER — GI COCKTAIL ~~LOC~~
30.0000 mL | Freq: Once | ORAL | Status: AC
  Administered 2016-11-04: 30 mL via ORAL
  Filled 2016-11-04: qty 30

## 2016-11-04 MED ORDER — LORAZEPAM 2 MG/ML IJ SOLN: 0.0000 mg | Freq: Four times a day (QID) | INTRAMUSCULAR | Status: DC

## 2016-11-04 MED ORDER — ALUM & MAG HYDROXIDE-SIMETH 200-200-20 MG/5ML PO SUSP: 30.0000 mL | Freq: Four times a day (QID) | ORAL | Status: DC | PRN

## 2016-11-04 NOTE — ED Provider Notes (Signed)
Patient denies feeling acutely depressed. States had drank heavily last night/early AM today.    Patient denies wanting to harm self or others.  He states he is staying in [place] [employer] and needs to go back there, and make sure his things are still there.   Pt was assessed by [facility] team yesterday, and felt not to be acutely suicidal or acute risk to self.  Currently pt has normal mood/affect, and denies SI.   Will provide resource guide, and outpt referrals.    Cathren Laine, MD 11/04/16 [phone]

## 2016-11-04 NOTE — Discharge Instructions (Signed)
It was our pleasure to provide your ER care today - we hope that you feel better.  Do not drink alcohol - follow up with AA, [facility],  and use resource guide provided for additional community resources.   For mental health issues and/or crisis, you may go directly to [facility] at any time.  Also follow up closely with primary care doctor.   Return to ER if worse, new symptoms, fevers, trouble breathing, other concern.

## 2016-11-04 NOTE — ED Notes (Signed)
Patient reports being discharged from ED yesterday and ingested alcohol "too many beers to count after three and some wine" and smoking "50 of a rock" of crack. Patient states after smoking crack he had sharp chest pains, was "seeing colors", felt lightheaded, short of breath and vomited. Patient states he doesn't want to live anymore. Patient endorses SI but denies specific plan or recent attempt. Pt sts he has had attempts in the past and points to scars on bilateral arms. Patient sts he has access to box cutter and bat to harm self.

## 2016-11-04 NOTE — ED Notes (Signed)
Got patient undress on the monitor did ekg shown to Dr Steinl 

## 2016-11-04 NOTE — ED Provider Notes (Signed)
[facility]-EMERGENCY DEPT Provider Note   CSN: [id number] Arrival date & time: 11/04/16  1121     History   Chief Complaint Chief Complaint  Patient presents with  . Chest Pain    HPI Donald Logan is a 61 y.o. male with a PMHx of peptic ulcers/GERD, polysubstance abuse, and substance induced mood disorder, who presents to the ED with multiple complaints, with his primary complaint being suicidal ideations without a plan, crack use 30 minutes prior to arrival, and alcohol use just prior to arrival. Chart review reveals he was seen on 11/02/16 for n/v, labs were reassuring and FOBT was negative, he was given zofran/protonix/fluids and ultimately discharged. He was seen again on 11/03/16 for SI and polysubstance abuse, medically cleared then psych saw him and felt he did not meet inpatient criteria so he was discharged home with outpatient resources around 12:30pm. Patient states that after he left, he continued to drink alcohol, proximally 3-4 quarts of beer consumed just prior to arrival. He states that he used crack about 30 minutes prior to arrival and then shortly thereafter he developed some epigastric/chest pain that he describes as 10/10 intermittent sharp nonradiating epigastric/central chest pain, with no known aggravating or alleviating factors. He states that he felt lightheaded, and "a little bit" short of breath, and then had 2 episodes of nonbloody nonbilious emesis with associated nausea. He also reports visual hallucinations "seeing different colors" when he closes his eyes. He denies HI or auditory hallucinations. He is a nonsmoker. He admits to using NSAIDs frequently, states he's taking Goody's powders quite frequently. Denies any known family history of cardiac disease.  He denies any diaphoresis, fevers, chills, cough, leg swelling, recent travel/surgery/immobilization, personal/family history of DVT/PE, hematemesis, melena, hematochezia, dysuria, hematuria, myalgias, arthralgias,  numbness, tingling, focal weakness, claudication, orthopnea, or any other complaints at this time. He is here voluntarily.    The history is provided by the patient and medical records. No language interpreter was used.  Chest Pain   This is a new problem. The current episode started less than 1 hour ago. The problem occurs rarely. The problem has been gradually improving. Associated with: crack use. The pain is present in the substernal region and epigastric region. The pain is at a severity of 10/10. The pain is moderate. The quality of the pain is described as sharp. The pain does not radiate. Duration of episode(s) is 30 minutes. Exacerbated by: nothing. Associated symptoms include abdominal pain, nausea, shortness of breath and vomiting. Pertinent negatives include no claudication, no cough, no diaphoresis, no fever, no lower extremity edema, no numbness, no orthopnea and no weakness. He has tried nothing for the symptoms. The treatment provided no relief.  Pertinent negatives for past medical history include no DVT and no PE.  Pertinent negatives for family medical history include: no CAD, no early MI and no PE.    Past Medical History:  Diagnosis Date  . Peptic ulcer   . Ulcer of abdomen wall [name])     Patient Active Problem List   Diagnosis Date Noted  . Substance induced mood disorder (HCC) 11/03/2016  . Polysubstance abuse (HCC) 11/03/2016  . Suicidal ideation     No past surgical history on file.     Home Medications    Prior to Admission medications   Medication Sig Start Date End Date Taking? Authorizing Provider  Aspirin-Acetaminophen-Caffeine (GOODY HEADACHE PO) Take 1 packet by mouth daily as needed (pain).    [provider]  Calcium Carbonate  Antacid (MAALOX) 600 MG chewable tablet Chew 1 tablet (600 mg total) by mouth every 4 (four) hours as needed for heartburn. Patient not taking: Reported on 11/02/2016 03/23/14   Gerhard Munch, MD  ondansetron  (ZOFRAN ODT) 8 MG disintegrating tablet Take 1 tablet (8 mg total) by mouth every 8 (eight) hours as needed for nausea or vomiting. 11/02/16   Azalia Bilis, MD  ranitidine (ZANTAC) 150 MG tablet Take 1 tablet (150 mg total) by mouth 2 (two) times daily. Patient not taking: Reported on 11/02/2016 03/23/14   Gerhard Munch, MD    Family History No family history on file.  Social History Social History  Substance Use Topics  . Smoking status: Never Smoker  . Smokeless tobacco: Never Used  . Alcohol use Yes     Allergies   Codeine   Review of Systems Review of Systems  Constitutional: Negative for chills, diaphoresis and fever.  Respiratory: Positive for shortness of breath. Negative for cough.   Cardiovascular: Positive for chest pain. Negative for orthopnea, claudication and leg swelling.  Gastrointestinal: Positive for abdominal pain, nausea and vomiting. Negative for blood in stool, constipation and diarrhea.  Genitourinary: Negative for dysuria and hematuria.  Musculoskeletal: Negative for arthralgias and myalgias.  Skin: Negative for color change.  Allergic/Immunologic: Negative for immunocompromised state.  Neurological: Positive for light-headedness. Negative for weakness and numbness.  Psychiatric/Behavioral: Positive for hallucinations and suicidal ideas. Negative for confusion.   All other systems reviewed and are negative for acute change except as noted in the HPI.    Physical Exam Updated Vital Signs BP 115/78   Pulse 80   Temp 98.6 F (37 C) (Oral)   Resp 20   Ht  (1.803 m)   Wt 49.9 kg (110 lb)   SpO2 93%   BMI 15.34 kg/m   Physical Exam  Constitutional: He is oriented to person, place, and time. Vital signs are normal. He appears well-developed and well-nourished.  Non-toxic appearance. No distress.  Afebrile, nontoxic, NAD  HENT:  Head: Normocephalic and atraumatic.  Mouth/Throat: Oropharynx is clear and moist and mucous membranes are normal.    Eyes: Conjunctivae and EOM are normal. Right eye exhibits no discharge. Left eye exhibits no discharge.  Neck: Normal range of motion. Neck supple.  Cardiovascular: Normal rate, regular rhythm, normal heart sounds and intact distal pulses.  Exam reveals no gallop and no friction rub.   No murmur heard. RRR, nl s1/s2, no m/r/g, distal pulses intact, no pedal edema   Pulmonary/Chest: Effort normal and breath sounds normal. No respiratory distress. He has no decreased breath sounds. He has no wheezes. He has no rhonchi. He has no rales. He exhibits tenderness. He exhibits no crepitus, no deformity and no retraction.    CTAB in all lung fields, no w/r/r, no hypoxia or increased WOB, speaking in full sentences, SpO2 95-96% on RA during evaluation Chest wall with mild anterior/sternal TTP extending to the epigastric area, without crepitus, deformities, or retractions   Abdominal: Soft. Normal appearance and bowel sounds are normal. He exhibits no distension. There is tenderness in the epigastric area. There is no rigidity, no rebound, no guarding, no CVA tenderness, no tenderness at McBurney's point and negative Murphy's sign.  Soft, nondistended, +BS throughout, with mild epigastric TTP, no r/g/r, neg murphy's, neg mcburney's, no CVA TTP   Musculoskeletal: Normal range of motion.  MAE x4 Strength and sensation grossly intact in all extremities Distal pulses intact No pedal edema, neg homan's bilaterally  Neurological: He is alert and oriented to person, place, and time. He has normal strength. No sensory deficit.  Skin: Skin is warm, dry and intact. No rash noted.  Psychiatric: He is not actively hallucinating. He exhibits a depressed mood. He expresses suicidal ideation. He expresses no homicidal ideation. He expresses no suicidal plans and no homicidal plans.  Depressed affect, but pleasant and cooperative. Endorsing SI without a plan, denies HI or AH, reports VH but doesn't seem to be responding  to internal stimuli.   Nursing note and vitals reviewed.    ED Treatments / Results  Labs (all labs ordered are listed, but only abnormal results are displayed) Labs Reviewed  CBC - Abnormal; Notable for the following:       Result Value   RBC 4.20 (*)    HCT 38.0 (*)    All other components within normal limits  COMPREHENSIVE METABOLIC PANEL - Abnormal; Notable for the following:    BUN 5 (*)    Calcium 8.6 (*)    AST 43 (*)    All other components within normal limits  ETHANOL - Abnormal; Notable for the following:    Alcohol, Ethyl (B) 307 (*)    All other components within normal limits  ACETAMINOPHEN LEVEL - Abnormal; Notable for the following:    Acetaminophen (Tylenol), Serum <10 (*)    All other components within normal limits  URINALYSIS, ROUTINE W REFLEX MICROSCOPIC - Abnormal; Notable for the following:    Color, Urine STRAW (*)    Specific Gravity, Urine 1.003 (*)    All other components within normal limits  SALICYLATE LEVEL  RAPID URINE DRUG SCREEN, HOSP PERFORMED  LIPASE, BLOOD  I-STAT TROPONIN, ED  I-STAT TROPONIN, ED    EKG  EKG Interpretation  Date/Time:  Thursday November 04 2016 11:36:30 EDT Ventricular Rate:  77 PR Interval:    QRS Duration: 88 QT Interval:  393 QTC Calculation: 445 R Axis:   77 Text Interpretation:  Sinus rhythm No significant change since last tracing Confirmed by Cathren Laine ([id number]) on 11/04/2016 11:42:21 AM Also confirmed by Cathren Laine ([id number]), editor Madalyn Rob [id number])  on 11/04/2016 12:21:47 PM       Radiology Dg Chest 2 View  Result Date: 11/04/2016 CLINICAL DATA:  Mid chest pain and shortness of breath for 2 hours prior to admission. Patient porch being on crack cocaine. Nonsmoker. History of peptic ulcer disease. EXAM: CHEST  2 VIEW COMPARISON:  Chest x-ray of July 07, 2016 FINDINGS: The lungs are well-expanded and clear. The heart and pulmonary vascularity are normal. The mediastinum is normal in width. The  bony thorax exhibits no acute abnormality. IMPRESSION: There is no active cardiopulmonary disease. Electronically Signed   By: David  [place] M.D.   On: 11/04/2016 11:58    Procedures Procedures (including critical care time)  Medications Ordered in ED Medications  LORazepam (ATIVAN) injection 0-4 mg (not administered)    Or  LORazepam (ATIVAN) tablet 0-4 mg (not administered)  LORazepam (ATIVAN) injection 0-4 mg (not administered)    Or  LORazepam (ATIVAN) tablet 0-4 mg (not administered)  thiamine (VITAMIN B-1) tablet 100 mg (not administered)    Or  thiamine (B-1) injection 100 mg (not administered)  zolpidem (AMBIEN) tablet 5 mg (not administered)  ondansetron (ZOFRAN) tablet 4 mg (not administered)  alum & mag hydroxide-simeth (MAALOX/MYLANTA) 200-200-20 MG/5ML suspension 30 mL (not administered)  famotidine (PEPCID) tablet 20 mg (not administered)  gi cocktail (Maalox,Lidocaine,Donnatal) (30 mLs Oral Given 11/04/16 1233)  famotidine (PEPCID) IVPB 20 mg premix (0 mg Intravenous Stopped 11/04/16 1414)  ondansetron (ZOFRAN) injection 4 mg (4 mg Intravenous Given 11/04/16 1234)  sodium chloride 0.9 % bolus 1,000 mL (0 mLs Intravenous Stopped 11/04/16 1414)     Initial Impression / Assessment and Plan / ED Course  I have reviewed the triage vital signs and the nursing notes.  Pertinent labs & imaging results that were available during my care of the patient were reviewed by me and considered in my medical decision making (see chart for details).     61 y.o. male here with multiple complaints, his primary complaint being SI without a plan, VH "seeing different colors", EtOH abuse, and crack abuse. Was seen yesterday for SI and was deemed to not meet IP criteria, sent home. Secondary complaints is central CP/epigastric pain that started about PTA after he smoked crack; associated lightheadedness, "a little" SOB, and n/v. Of note, he was seen for n/v two days ago and lab work was  reassuring so he was discharged home. He is homeless and I believe there may be some secondary gain from his visits here. On exam, mild epigastric TTP, nonperitoneal, neg murphy's; mild central chest wall TTP, lung exam clear, no hypoxia during exam, no tachycardia, no LE swelling. Endorses NSAID use regularly. His CP is likely actually gastritis related pain, vs vasospasm from crack use. UDS negative, troponin neg, CXR neg, EKG nonischemic, and CBC WNL. Awaiting remainder of work up, will add-on lipase and U/A, and get delta troponin at 3hr mark. Once medically cleared then will obtain TTS consultation again. Will give GI cocktail, pepcid, zofran, and fluids then reassess shortly.   3:49 PM CMP with very marginally elevated AST 43, likely from EtOH use. Lipase WNL. U/A unremarkable. EtOH level 307. Salicylate and acetaminophen levels WNL. Delta trop negative. Pt feeling better and tolerating PO well. Pt medically cleared at this time. Psych hold orders and CIWA med orders placed. Also ordered pepcid  BID for while he's here. Please see TTS notes for further documentation of care/dispo. PLEASE NOTE THAT PT IS HERE VOLUNTARILY AT THIS TIME, IF PT TRIES TO LEAVE THEY WOULD NEED IVC PAPERWORK TAKEN OUT. Pt stable at time of med clearance.      Final Clinical Impressions(s) / ED Diagnoses   Final diagnoses:  Chest wall pain  Epigastric pain  Chronic alcoholic gastritis, presence of bleeding unspecified  Polysubstance abuse (HCC)  Alcohol abuse  Suicidal ideation  Nausea and vomiting in adult patient  Medical clearance for psychiatric admission    New Prescriptions New Prescriptions   No medications on [address] [place], [place] 11/04/16 1549    Cathren Laine, MD 11/05/16 1526

## 2016-11-04 NOTE — ED Triage Notes (Signed)
Pt called EMS due to cp started this morning with associated shob, and nausea with an episode of vomiting. Denies diaphoresis. Pt admits to etoh consumption today. Pt endorses SI was discharged yesterday from [facility] due to similar complaints.   EKG- NSR

## 2016-12-08 ENCOUNTER — Emergency Department (HOSPITAL_COMMUNITY)
Admission: EM | Admit: 2016-12-08 | Discharge: 2016-12-08 | Disposition: A | Discharge status: Home / Self Care | Payer: Medicaid Other | Attending: Physician Assistant | Admitting: Physician Assistant

## 2016-12-08 ENCOUNTER — Other Ambulatory Visit: Payer: Self-pay

## 2016-12-08 ENCOUNTER — Encounter (HOSPITAL_COMMUNITY): Payer: Self-pay | Admitting: Emergency Medicine

## 2016-12-08 DIAGNOSIS — Z5321 Procedure and treatment not carried out due to patient leaving prior to being seen by health care provider: Secondary | ICD-10-CM | POA: Diagnosis not present

## 2016-12-08 DIAGNOSIS — R45851 Suicidal ideations: Secondary | ICD-10-CM | POA: Diagnosis not present

## 2016-12-08 DIAGNOSIS — Z7689 Persons encountering health services in other specified circumstances: Secondary | ICD-10-CM | POA: Diagnosis present

## 2016-12-08 LAB — COMPREHENSIVE METABOLIC PANEL
ALK PHOS: 44 U/L (ref 38–126)
ALT: 29 U/L (ref 17–63)
ANION GAP: 8 (ref 5–15)
AST: 31 U/L (ref 15–41)
Albumin: 3.9 g/dL (ref 3.5–5.0)
CALCIUM: 8.9 mg/dL (ref 8.9–10.3)
CO2: 23 mmol/L (ref 22–32)
Chloride: 108 mmol/L (ref 101–111)
Creatinine, Ser: 0.8 mg/dL (ref 0.61–1.24)
GFR calc Af Amer: >60 mL/min (ref >60)
GLUCOSE: 150 mg/dL — AB (ref 65–99)
POTASSIUM: 3.9 mmol/L (ref 3.5–5.1)
Sodium: 139 mmol/L (ref 135–145)
TOTAL PROTEIN: 7.1 g/dL (ref 6.5–8.1)
Total Bilirubin: 0.6 mg/dL (ref 0.3–1.2)

## 2016-12-08 LAB — CBC
HEMATOCRIT: 39.8 % (ref 39.0–52.0)
Hemoglobin: 13.5 g/dL (ref 13.0–17.0)
MCH: 31 pg (ref 26.0–34.0)
MCHC: 33.9 g/dL (ref 30.0–36.0)
MCV: 91.5 fL (ref 78.0–100.0)
PLATELETS: 407 10*3/uL — AB (ref 150–400)
RBC: 4.35 MIL/uL (ref 4.22–5.81)
RDW: 14.6 % (ref 11.5–15.5)
WBC: 6.4 10*3/uL (ref 4.0–10.5)

## 2016-12-08 LAB — RAPID URINE DRUG SCREEN, HOSP PERFORMED
Amphetamines: NOT DETECTED
BARBITURATES: NOT DETECTED
BENZODIAZEPINES: NOT DETECTED
Cocaine: NOT DETECTED
Opiates: NOT DETECTED
Tetrahydrocannabinol: NOT DETECTED

## 2016-12-08 LAB — ACETAMINOPHEN LEVEL: Acetaminophen (Tylenol), Serum: <10 ug/mL — ABNORMAL LOW (ref 10–30)

## 2016-12-08 LAB — SALICYLATE LEVEL: Salicylate Lvl: <7 mg/dL (ref 2.8–30.0)

## 2016-12-08 LAB — ETHANOL: ALCOHOL ETHYL (B): 297 mg/dL — AB (ref <10)

## 2016-12-08 NOTE — ED Triage Notes (Signed)
Per Johnston EbbsKoula, RN, Dr. Adriana Simasook saw patient in triage and stated "he can go if he wants." Pt ambulatory with steady gait, made a couple calls in waiting room and then ambulated to bus stop after being given bus pass.

## 2016-12-08 NOTE — ED Triage Notes (Signed)
Pt states here for detox from heroin, alcohol and cocaine. Pt states he is suicidal and his plan is to jump off of a bridge. Pt given scrubs and belongings placed in bags. Pt states last use of heroin and cocaine was last night, drank alcohol right before entering ED. Pt talking to himself when nurse not in room.

## 2017-02-18 ENCOUNTER — Other Ambulatory Visit: Payer: Self-pay | Admitting: Family Medicine

## 2017-02-18 DIAGNOSIS — R1010 Upper abdominal pain, unspecified: Secondary | ICD-10-CM

## 2017-02-18 DIAGNOSIS — R1030 Lower abdominal pain, unspecified: Secondary | ICD-10-CM

## 2017-02-23 ENCOUNTER — Inpatient Hospital Stay: Admission: RE | Admit: 2017-02-23 | Payer: Self-pay | Source: Ambulatory Visit

## 2017-03-08 ENCOUNTER — Other Ambulatory Visit: Payer: Self-pay

## 2017-04-07 ENCOUNTER — Encounter (HOSPITAL_COMMUNITY): Payer: Self-pay

## 2017-04-07 ENCOUNTER — Emergency Department (HOSPITAL_COMMUNITY)
Admission: EM | Admit: 2017-04-07 | Discharge: 2017-04-07 | Disposition: A | Discharge status: Home / Self Care | Payer: Medicaid Other | Attending: Emergency Medicine | Admitting: Emergency Medicine

## 2017-04-07 DIAGNOSIS — R45851 Suicidal ideations: Secondary | ICD-10-CM | POA: Insufficient documentation

## 2017-04-07 DIAGNOSIS — F1910 Other psychoactive substance abuse, uncomplicated: Secondary | ICD-10-CM | POA: Insufficient documentation

## 2017-04-07 LAB — CBC
HEMATOCRIT: 38.3 % — AB (ref 39.0–52.0)
Hemoglobin: 12.7 g/dL — ABNORMAL LOW (ref 13.0–17.0)
MCH: 31.4 pg (ref 26.0–34.0)
MCHC: 33.2 g/dL (ref 30.0–36.0)
MCV: 94.6 fL (ref 78.0–100.0)
PLATELETS: 322 10*3/uL (ref 150–400)
RBC: 4.05 MIL/uL — ABNORMAL LOW (ref 4.22–5.81)
RDW: 14.3 % (ref 11.5–15.5)
WBC: 4.6 10*3/uL (ref 4.0–10.5)

## 2017-04-07 LAB — COMPREHENSIVE METABOLIC PANEL
ALK PHOS: 42 U/L (ref 38–126)
ALT: 23 U/L (ref 17–63)
AST: 32 U/L (ref 15–41)
Albumin: 4 g/dL (ref 3.5–5.0)
Anion gap: 13 (ref 5–15)
BILIRUBIN TOTAL: 0.6 mg/dL (ref 0.3–1.2)
BUN: 7 mg/dL (ref 6–20)
CO2: 21 mmol/L — ABNORMAL LOW (ref 22–32)
CREATININE: 0.73 mg/dL (ref 0.61–1.24)
Calcium: 8.6 mg/dL — ABNORMAL LOW (ref 8.9–10.3)
Chloride: 106 mmol/L (ref 101–111)
GFR calc Af Amer: >60 mL/min (ref >60)
GFR calc non Af Amer: >60 mL/min (ref >60)
Glucose, Bld: 90 mg/dL (ref 65–99)
Potassium: 4.1 mmol/L (ref 3.5–5.1)
Sodium: 140 mmol/L (ref 135–145)
TOTAL PROTEIN: 6.6 g/dL (ref 6.5–8.1)

## 2017-04-07 LAB — RAPID URINE DRUG SCREEN, HOSP PERFORMED
AMPHETAMINES: NOT DETECTED
BARBITURATES: NOT DETECTED
Benzodiazepines: NOT DETECTED
Cocaine: NOT DETECTED
Opiates: NOT DETECTED
Tetrahydrocannabinol: NOT DETECTED

## 2017-04-07 LAB — I-STAT TROPONIN, ED: Troponin i, poc: 0 ng/mL (ref 0.00–0.08)

## 2017-04-07 LAB — SALICYLATE LEVEL: Salicylate Lvl: <7 mg/dL (ref 2.8–30.0)

## 2017-04-07 LAB — ETHANOL: Alcohol, Ethyl (B): 263 mg/dL — ABNORMAL HIGH (ref <10)

## 2017-04-07 LAB — ACETAMINOPHEN LEVEL: Acetaminophen (Tylenol), Serum: <10 ug/mL — ABNORMAL LOW (ref 10–30)

## 2017-04-07 MED ORDER — LORAZEPAM 1 MG PO TABS
1.0000 mg | ORAL_TABLET | Freq: Once | ORAL | Status: AC
  Administered 2017-04-07: 1 mg via ORAL
  Filled 2017-04-07: qty 1

## 2017-04-07 NOTE — ED Notes (Addendum)
Dr. Madilyn Hookees at bedside  Aware that pt is wanting to leave, has spoken to pt about this.

## 2017-04-07 NOTE — ED Notes (Signed)
Refused discharge vitals.

## 2017-04-07 NOTE — ED Notes (Signed)
Pt denies SI/HI at this time. Denies hallucinations. PT states "I jujst want detox and you all aren't doing anything for it so I want to leave." This RN informed Dr. Madilyn Hookees and Christen Butterhris L. PA.

## 2017-04-07 NOTE — ED Triage Notes (Addendum)
PT reports he wants to detox from alcohol, cocaine, meth, and heroin. PT states he had  A 40oz beer and 2 bottles of wine and smoked crack about 1 hour pta. Pt states he has been thinking about jumping off bridge or cutting wrist again to kill himself. PT endorses cp since smoking crack

## 2017-04-07 NOTE — ED Notes (Signed)
EMT to nurse first reports pt just walked out of triage. Seen walking to bus stop. Pt was not IVC'd. 2 EMTs out to bus stop to try to get patient to come back. CN made aware. Room was available for patient.

## 2017-04-07 NOTE — ED Provider Notes (Signed)
[facility] EMERGENCY DEPARTMENT Provider Note   CSN: [id number] Arrival date & time: 04/07/17  1110     History   Chief Complaint Chief Complaint  Patient presents with  . Suicidal  . Drug / Alcohol Assessment  . Chest Pain    HPI Donald Logan is a 62 y.o. male.  HPI Patient presents to the emergency department with requesting detox from heroin alcohol and cocaine.  The patient states that he is depressed all the time and states that he has thoughts of always wanting to in this lifestyle.  The patient does not have a concrete plan for any harm to himself.  Patient states that he really just wants detox.  The patient denies chest pain, shortness of breath, headache,blurred vision, neck pain, fever, cough, weakness, numbness, dizziness, anorexia, edema, abdominal pain, nausea, vomiting, diarrhea, rash, back pain, dysuria, hematemesis, bloody stool, near syncope, or syncope. Past Medical History:  Diagnosis Date  . Peptic ulcer   . Substance abuse (HCC)   . Ulcer of abdomen wall [facility])     Patient Active Problem List   Diagnosis Date Noted  . Substance induced mood disorder (HCC) 11/03/2016  . Polysubstance abuse (HCC) 11/03/2016  . Suicidal ideation     History reviewed. No pertinent surgical history.     Home Medications    Prior to Admission medications   Medication Sig Start Date End Date Taking? Authorizing Provider  Aspirin-Acetaminophen-Caffeine (GOODY HEADACHE PO) Take 1 packet by mouth daily as needed (pain).    [provider]  Calcium Carbonate Antacid (MAALOX) 600 MG chewable tablet Chew 1 tablet (600 mg total) by mouth every 4 (four) hours as needed for heartburn. 03/23/14   Gerhard Munch, MD  ondansetron (ZOFRAN ODT) 8 MG disintegrating tablet Take 1 tablet (8 mg total) by mouth every 8 (eight) hours as needed for nausea or vomiting. 11/02/16   Azalia Bilis, MD  ranitidine (ZANTAC) 150 MG tablet Take 1 tablet (150 mg total) by  mouth 2 (two) times daily. 03/23/14   Gerhard Munch, MD    Family History No family history on file.  Social History Social History   Tobacco Use  . Smoking status: Never Smoker  . Smokeless tobacco: Never Used  Substance Use Topics  . Alcohol use: Yes    Comment: sts at least 6 cans of beer daily- but looses count   . Drug use: Yes    Types: Cocaine, Methamphetamines     Allergies   Codeine   Review of Systems Review of Systems   Physical Exam Updated Vital Signs BP 102/72   Pulse 78   Temp 98.3 F (36.8 C) (Oral)   Resp 18   Ht 6\' 1"  (1.854 m)   Wt 54.4 kg (120 lb)   SpO2 97%   BMI 15.83 kg/m   Physical Exam  Constitutional: He is oriented to person, place, and time. He appears well-developed and well-nourished. No distress.  HENT:  Head: Normocephalic and atraumatic.  Eyes: Pupils are equal, round, and reactive to light.  Neck: Normal range of motion. Neck supple.  Cardiovascular: Normal rate, regular rhythm and normal pulses.  Pulmonary/Chest: Effort normal and breath sounds normal. He has no decreased breath sounds. He has no wheezes. He has no rales.  Musculoskeletal: He exhibits no edema.  Neurological: He is alert and oriented to person, place, and time.  Skin: Skin is warm and dry.  Psychiatric: His affect is angry. He is agitated. He expresses no  homicidal ideation. He expresses no suicidal plans and no homicidal plans.  Nursing note and vitals reviewed.    ED Treatments / Results  Labs (all labs ordered are listed, but only abnormal results are displayed) Labs Reviewed  COMPREHENSIVE METABOLIC PANEL - Abnormal; Notable for the following components:      Result Value   CO2 21 (*)    Calcium 8.6 (*)    All other components within normal limits  ETHANOL - Abnormal; Notable for the following components:   Alcohol, Ethyl (B) 263 (*)    All other components within normal limits  ACETAMINOPHEN LEVEL - Abnormal; Notable for the following  components:   Acetaminophen (Tylenol), Serum <10 (*)    All other components within normal limits  CBC - Abnormal; Notable for the following components:   RBC 4.05 (*)    Hemoglobin 12.7 (*)    HCT 38.3 (*)    All other components within normal limits  SALICYLATE LEVEL  RAPID URINE DRUG SCREEN, HOSP PERFORMED  I-STAT TROPONIN, ED    EKG  EKG Interpretation None       Radiology No results found.  Procedures Procedures (including critical care time)  Medications Ordered in ED Medications  LORazepam (ATIVAN) tablet 1 mg (1 mg Oral Given 04/07/17 1517)     Initial Impression / Assessment and Plan / ED Course  I have reviewed the triage vital signs and the nursing notes.  Pertinent labs & imaging results that were available during my care of the patient were reviewed by me and considered in my medical decision making (see chart for details).     Patient is demanding to leave.  I went and spoke with him and he denies any suicidal or homicidal ideation he states that he just wants to leave because her to do anything for his detox and helping him with withdrawal patient is showing no signs of tremulousness tachycardia hallucinations altered mental status nausea vomiting or other withdrawal symptoms.  Patient became very angry and demanded to leave.  At this time the patient is denying any suicidal or homicidal ideation he initially was saying that he just did not want to live this lifestyle any longer but no concrete plan of suicidality. Final Clinical Impressions(s) / ED Diagnoses   Final diagnoses:  None    ED Discharge Orders    None       Charlestine NightLawyer, [place] Zaino, PA-C 04/07/17 1556    Tilden Fossaees, Elizabeth, MD 04/13/17 1234

## 2017-04-07 NOTE — ED Notes (Signed)
Harl FavorBetty Logan is sister. Her telephone number is [phone].

## 2017-04-11 ENCOUNTER — Encounter (HOSPITAL_COMMUNITY): Payer: Self-pay

## 2017-04-11 ENCOUNTER — Emergency Department (HOSPITAL_COMMUNITY): Payer: Medicaid Other

## 2017-04-11 ENCOUNTER — Emergency Department (HOSPITAL_COMMUNITY)
Admission: EM | Admit: 2017-04-11 | Discharge: 2017-04-12 | Disposition: A | Discharge status: Home / Self Care | Payer: Medicaid Other | Attending: Emergency Medicine | Admitting: Emergency Medicine

## 2017-04-11 ENCOUNTER — Other Ambulatory Visit: Payer: Self-pay

## 2017-04-11 DIAGNOSIS — F1994 Other psychoactive substance use, unspecified with psychoactive substance-induced mood disorder: Secondary | ICD-10-CM | POA: Diagnosis present

## 2017-04-11 DIAGNOSIS — Y908 Blood alcohol level of 240 mg/100 ml or more: Secondary | ICD-10-CM | POA: Diagnosis not present

## 2017-04-11 DIAGNOSIS — F332 Major depressive disorder, recurrent severe without psychotic features: Secondary | ICD-10-CM | POA: Diagnosis present

## 2017-04-11 DIAGNOSIS — F10129 Alcohol abuse with intoxication, unspecified: Secondary | ICD-10-CM | POA: Diagnosis not present

## 2017-04-11 DIAGNOSIS — R450 Nervousness: Secondary | ICD-10-CM | POA: Diagnosis not present

## 2017-04-11 DIAGNOSIS — F10920 Alcohol use, unspecified with intoxication, uncomplicated: Secondary | ICD-10-CM | POA: Diagnosis not present

## 2017-04-11 DIAGNOSIS — R45851 Suicidal ideations: Secondary | ICD-10-CM | POA: Diagnosis not present

## 2017-04-11 HISTORY — DX: Other psychoactive substance dependence, uncomplicated: F19.20

## 2017-04-11 LAB — COMPREHENSIVE METABOLIC PANEL
ALT: 19 U/L (ref 17–63)
AST: 26 U/L (ref 15–41)
Albumin: 4.2 g/dL (ref 3.5–5.0)
Alkaline Phosphatase: 40 U/L (ref 38–126)
Anion gap: 9 (ref 5–15)
BILIRUBIN TOTAL: 0.4 mg/dL (ref 0.3–1.2)
BUN: 9 mg/dL (ref 6–20)
CO2: 23 mmol/L (ref 22–32)
CREATININE: 0.82 mg/dL (ref 0.61–1.24)
Calcium: 8.4 mg/dL — ABNORMAL LOW (ref 8.9–10.3)
Chloride: 110 mmol/L (ref 101–111)
GFR calc Af Amer: >60 mL/min (ref >60)
Glucose, Bld: 93 mg/dL (ref 65–99)
Potassium: 3.7 mmol/L (ref 3.5–5.1)
Sodium: 142 mmol/L (ref 135–145)
TOTAL PROTEIN: 7.3 g/dL (ref 6.5–8.1)

## 2017-04-11 LAB — RAPID URINE DRUG SCREEN, HOSP PERFORMED
Amphetamines: NOT DETECTED
Barbiturates: NOT DETECTED
Benzodiazepines: NOT DETECTED
Cocaine: NOT DETECTED
Opiates: NOT DETECTED
Tetrahydrocannabinol: NOT DETECTED

## 2017-04-11 LAB — CBC
HCT: 37.2 % — ABNORMAL LOW (ref 39.0–52.0)
Hemoglobin: 12.1 g/dL — ABNORMAL LOW (ref 13.0–17.0)
MCH: 31.3 pg (ref 26.0–34.0)
MCHC: 32.5 g/dL (ref 30.0–36.0)
MCV: 96.1 fL (ref 78.0–100.0)
PLATELETS: 248 10*3/uL (ref 150–400)
RBC: 3.87 MIL/uL — ABNORMAL LOW (ref 4.22–5.81)
RDW: 14.6 % (ref 11.5–15.5)
WBC: 4.5 10*3/uL (ref 4.0–10.5)

## 2017-04-11 LAB — ACETAMINOPHEN LEVEL: Acetaminophen (Tylenol), Serum: <10 ug/mL — ABNORMAL LOW (ref 10–30)

## 2017-04-11 LAB — SALICYLATE LEVEL: Salicylate Lvl: <7 mg/dL (ref 2.8–30.0)

## 2017-04-11 LAB — ETHANOL: ALCOHOL ETHYL (B): 336 mg/dL — AB (ref <10)

## 2017-04-11 MED ORDER — THIAMINE HCL 100 MG/ML IJ SOLN: 100.0000 mg | Freq: Once | INTRAMUSCULAR | Status: DC

## 2017-04-11 MED ORDER — SODIUM CHLORIDE 0.9 % IV BOLUS (SEPSIS)
1000.0000 mL | Freq: Once | INTRAVENOUS | Status: AC
Start: 2017-04-11 — End: 2017-04-11
  Administered 2017-04-11: 1000 mL via INTRAVENOUS

## 2017-04-11 MED ORDER — VITAMIN B-1 100 MG PO TABS
100.0000 mg | ORAL_TABLET | Freq: Once | ORAL | Status: AC
  Administered 2017-04-11: 100 mg via ORAL
  Filled 2017-04-11: qty 1

## 2017-04-11 NOTE — ED Notes (Signed)
SBAR Report received from previous nurse. Pt received irritable on unit. Pt denies current  A/V H, depression, anxiety, or pain at this time, and appears otherwise stable and free of distress. Pt refused to answer assessment questions about SI/ HI at this time Pt reminded of camera surveillance, q 15 min rounds, and rules of the milieu. Will continue to assess.

## 2017-04-11 NOTE — ED Notes (Signed)
Patient transported to X-ray 

## 2017-04-11 NOTE — BH Assessment (Addendum)
Assessment Note  Donald Logan is an 62 y.o. male. The pt came in after being discharged from his alcohol use program with [facility].  The pt initially came to the emergency room last week stating he wanted detox and that he was suicidal.  He later stated he was not suicidal and and wanted to leave.  The pt was discharged. The pt later came back to the ED today  stating he wants detox from alcohol and is suicidal with a plan to jump off of a bridge or to cut his wrist. The pt reported he has cut his wrist several time and showed the counselor several scars from previous cuts.  It is unclear how many times the pt has been hospitalized for psychiatric reasons, if any.  The pt last went to an inpatient detox facility in 2018 and he reports he went to [facility] in [place] and [address] in [place].  He was going to [facility] in [place].  He initially stated [employer] kicked him out of their program and then he stated his roommate kicked him out of the hotel he was staying in.   The pt is abusing alcohol and came in with a blood alcohol level of 336.  He reports he also uses cocaine when he can get it and crystal meth occasionally.  His UDS was negative for all substances.  The pt reported he last used cocaine today.   The pt stated he has been feeling depressed for several months, because he is tired of living the way he is living and has nothing to live for.  He reports he is not sleeping well and doesn't have an appetite.  The pt was irritable through out the assessment and spoke with slurred speech.  There were some questions the pt did not answer, such as, when asked about abuse the pt stated, "My family is all dead."  The pt later stated, "I'm about tired of you and your mouth."  The pt denies HI and psychosis  Diagnosis: F33.2 Major depressive disorder, Recurrent episode, Severe F10.20 Alcohol use disorder, Severe F14.20 Cocaine use disorder, Moderate  Past  Medical History:  Past Medical History:  Diagnosis Date  . Peptic ulcer   . Polysubstance (excluding opioids) dependence (HCC)   . Substance abuse (HCC)   . Ulcer of abdomen wall (HCC)     History reviewed. No pertinent surgical history.  Family History: No family history on file.  Social History:  reports that  has never smoked. he has never used smokeless tobacco. He reports that he drinks alcohol. He reports that he uses drugs. Drugs: Cocaine and Methamphetamines.  Additional Social History:  Alcohol / Drug Use Pain Medications: See MAR Prescriptions: See MAR Over the Counter: See MAR History of alcohol / drug use?: Yes Longest period of sobriety (when/how long): unknown Substance #1 Name of Substance 1: alcohol 1 - Last Use / Amount: 04/11/2017 Substance #2 Name of Substance 2: Cocaine 2 - Last Use / Amount: 04/11/2017 Substance #3 Name of Substance 3: Crystal meth 3 - Last Use / Amount: unable to assess  CIWA: CIWA-Ar BP: 104/64 Pulse Rate: 75 Nausea and Vomiting: no nausea and no vomiting Tactile Disturbances: none Tremor: not visible, but can be felt fingertip to fingertip Auditory Disturbances: not present Paroxysmal Sweats: no sweat visible Visual Disturbances: not present Anxiety: mildly anxious Headache, Fullness in Head: none present Agitation: normal activity Orientation and Clouding of Sensorium: cannot do serial  additions or is uncertain about date CIWA-Ar Total: 3 COWS:    Allergies:  Allergies  Allergen Reactions  . Codeine Nausea And Vomiting    Home Medications:  (Not in a hospital admission)  OB/GYN Status:  No LMP for male patient.  General Assessment Data Location of Assessment: [facility] ED TTS Assessment: In system Is this a Tele or Face-to-Face Assessment?: Face-to-Face Is this an Initial Assessment or a Re-assessment for this encounter?: Initial Assessment Marital status: Divorced Toftrees name: NA Is patient pregnant?: Other  (Comment)(male) Living Arrangements: Other (Comment)(homeless) Can pt return to current living arrangement?: Yes Admission Status: Voluntary Is patient capable of signing voluntary admission?: Yes Referral Source: Self/Family/Friend Insurance type: Medicaid     Crisis Care Plan Living Arrangements: Other (Comment)(homeless) Legal Guardian: Other:(Self) Name of Psychiatrist: none Name of Therapist: none  Education Status Is patient currently in school?: No Is the patient employed, unemployed or receiving disability?: Unemployed  Risk to self with the past 6 months Suicidal Ideation: Yes-Currently Present Has patient been a risk to self within the past 6 months prior to admission? : No Suicidal Intent: Yes-Currently Present Has patient had any suicidal intent within the past 6 months prior to admission? : Yes Is patient at risk for suicide?: Yes Suicidal Plan?: Yes-Currently Present Has patient had any suicidal plan within the past 6 months prior to admission? : Yes Specify Current Suicidal Plan: jump off a bridge or cut wrist Access to Means: Yes Specify Access to Suicidal Means: can get to a bridge or find somehting to cut self What has been your use of drugs/alcohol within the last 12 months?: excessive alcohol use.  Some cocaine and crystal meth usage Previous Attempts/Gestures: Yes How many times?: (multiple) Other Self Harm Risks: cutting Triggers for Past Attempts: Unknown Intentional Self Injurious Behavior: Cutting Comment - Self Injurious Behavior: history of cutting Family Suicide History: Unknown Recent stressful life event(s): Other (Comment)(homeless and alcohol usage) Persecutory voices/beliefs?: No Depression: Yes Depression Symptoms: Feeling worthless/self pity, Feeling angry/irritable Substance abuse history and/or treatment for substance abuse?: Yes Suicide prevention information given to non-admitted patients: Not applicable  Risk to Others within the  past 6 months Homicidal Ideation: No Does patient have any lifetime risk of violence toward others beyond the six months prior to admission? : No Thoughts of Harm to Others: No Current Homicidal Intent: No Current Homicidal Plan: No Access to Homicidal Means: No Identified Victim: NA History of harm to others?: No Assessment of Violence: None Noted Violent Behavior Description: none Does patient have access to weapons?: No Criminal Charges Pending?: No Does patient have a court date: No Is patient on probation?: No  Psychosis Hallucinations: None noted Delusions: None noted  Mental Status Report Appearance/Hygiene: In scrubs, Unremarkable Eye Contact: Poor Motor Activity: Freedom of movement Speech: Slurred, Argumentative Level of Consciousness: Drowsy, Irritable Mood: Irritable Affect: Irritable Anxiety Level: None Thought Processes: Coherent, Relevant Judgement: Impaired Orientation: Person, Place, Time, Situation, Appropriate for developmental age Obsessive Compulsive Thoughts/Behaviors: None  Cognitive Functioning Concentration: Decreased Memory: Recent Intact, Remote Intact Is patient IDD: No Is patient DD?: No Insight: Poor Impulse Control: Poor Appetite: Poor Have you had any weight changes? : No Change Sleep: Decreased Total Hours of Sleep: 2 Vegetative Symptoms: None  ADLScreening [facility] Assessment Services) Patient's cognitive ability adequate to safely complete daily activities?: Yes Patient able to express need for assistance with ADLs?: Yes Independently performs ADLs?: Yes (appropriate for developmental age)  Prior Inpatient Therapy Prior Inpatient Therapy: Yes Prior Therapy  Dates: 2018 and several others Prior Therapy Facilty/Provider(s): [facility], [facility] Reason for Treatment: alcohol use  Prior Outpatient Therapy Prior Outpatient Therapy: Yes Prior Therapy Dates: 04/2017 [facility] Reason for Treatment:  alcohol use Does patient have an ACCT team?: No Does patient have Intensive In-House Services?  : No Does patient have [facility] services? : No Does patient have P4CC services?: No  ADL Screening (condition at time of admission) Patient's cognitive ability adequate to safely complete daily activities?: Yes Patient able to express need for assistance with ADLs?: Yes Independently performs ADLs?: Yes (appropriate for developmental age)       Abuse/Neglect Assessment (Assessment to be complete while patient is alone) Abuse/Neglect Assessment Can Be Completed: Yes Physical Abuse: Denies Verbal Abuse: Denies Sexual Abuse: Denies Exploitation of patient/patient's resources: Denies Self-Neglect: Denies Values / Beliefs Cultural Requests During Hospitalization: None Spiritual Requests During Hospitalization: None Consults Spiritual Care Consult Needed: No Social Work Consult Needed: No Merchant navy officerAdvance Directives (For Healthcare) Does Patient Have a Medical Advance Directive?: No Would patient like information on creating a medical advance directive?: No - Patient declined    Additional Information 1:1 In Past 12 Months?: No CIRT Risk: No Elopement Risk: No Does patient have medical clearance?: Yes     Disposition:  Disposition Initial Assessment Completed for this Encounter: Yes Disposition of Patient: (observe over night and reassess) Patient refused recommended treatment: No Mode of transportation if patient is discharged?: N/A   PA Donell SievertSpencer Simon recommends the pt be observed and reassessed in the AM.  Consuella LoseElaine RN has been made aware of the recommendations  On Site Evaluation by:   Reviewed with Physician:    Ottis StainGarvin, Kushal Saunders Jermaine 04/11/2017 9:10 PM

## 2017-04-11 NOTE — ED Notes (Signed)
Date and time results received: 04/11/17 "7:42 PM (use smartphrase ".now" to insert current time)  Test: Alcohol Critical Value: 336  Name of Provider Notified:  Orders Received? Or Actions Taken?:

## 2017-04-11 NOTE — ED Provider Notes (Signed)
[facility]-EMERGENCY DEPT Provider Note   CSN: [id number] Arrival date & time: 04/11/17  1758     History   Chief Complaint Chief Complaint  Patient presents with  . Suicidal  . Alcohol Intoxication    HPI Donald Logan is a 62 y.o. male with history of peptic ulcer disease, polysubstance abuse, EtOH abuse, homelessness is here requesting detox from alcohol and drugs. States he is tired of his lifestyle. Last alcoholic beverage 1.5 hours ago, has used "everything" today including meth, cocaine. Also endorsing suicidal ideation with plan to jump off a bridge. States he has tried to commit suicide multiple times before. Reports abdominal pain that states is always there.  No fevers, vomiting, diarrhea, exertional chest painor shortness of breath. Reports chronic cough, wet sounding and productive.  HPI  Past Medical History:  Diagnosis Date  . Peptic ulcer   . Polysubstance (excluding opioids) dependence (HCC)   . Substance abuse (HCC)   . Ulcer of abdomen wall [facility])     Patient Active Problem List   Diagnosis Date Noted  . Substance induced mood disorder (HCC) 11/03/2016  . Polysubstance abuse (HCC) 11/03/2016  . Suicidal ideation     History reviewed. No pertinent surgical history.     Home Medications    Prior to Admission medications   Medication Sig Start Date End Date Taking? Authorizing Provider  Calcium Carbonate Antacid (MAALOX) 600 MG chewable tablet Chew 1 tablet (600 mg total) by mouth every 4 (four) hours as needed for heartburn. Patient not taking: Reported on 04/11/2017 03/23/14   Gerhard Munch, MD  ondansetron (ZOFRAN ODT) 8 MG disintegrating tablet Take 1 tablet (8 mg total) by mouth every 8 (eight) hours as needed for nausea or vomiting. Patient not taking: Reported on 04/11/2017 11/02/16   Azalia Bilis, MD  ranitidine (ZANTAC) 150 MG tablet Take 1 tablet (150 mg total) by mouth 2 (two) times daily. Patient not taking:  Reported on 04/11/2017 03/23/14   Gerhard Munch, MD    Family History No family history on file.  Social History Social History   Tobacco Use  . Smoking status: Never Smoker  . Smokeless tobacco: Never Used  Substance Use Topics  . Alcohol use: Yes    Comment: sts at least 6 cans of beer daily- but looses count   . Drug use: Yes    Types: Cocaine, Methamphetamines     Allergies   Codeine   Review of Systems Review of Systems  Respiratory: Positive for cough (chronic).   Gastrointestinal: Positive for abdominal pain (chronic).  Psychiatric/Behavioral: Positive for dysphoric mood and suicidal ideas.  All other systems reviewed and are negative.    Physical Exam Updated Vital Signs BP 104/64 (BP Location: Right Arm)   Pulse 75   Temp 98.1 F (36.7 C) (Oral)   Resp 16   Ht 6\' 1"  (1.854 m)   Wt 54.4 kg (120 lb)   SpO2 97%   BMI 15.83 kg/m   Physical Exam  Constitutional: He is oriented to person, place, and time. He appears well-developed and well-nourished. No distress.  Non toxic.  HENT:  Head: Normocephalic and atraumatic.  Nose: Nose normal.  Mouth/Throat: No oropharyngeal exudate.  Moist mucous membranes. Atraumatic.No intraoral or tongue injury.  Eyes: Conjunctivae and EOM are normal. Pupils are equal, round, and reactive to light.  Neck: Normal range of motion.  Cardiovascular: Normal rate, regular rhythm, normal heart sounds and intact distal pulses.  No murmur heard. 2+ DP  and radial pulses bilaterally. No LE edema.   Pulmonary/Chest: Effort normal and breath sounds normal.  Abdominal: Soft. Bowel sounds are normal. There is tenderness.  Mild suprapubic tenderness. No G/R/R. No CVA tenderness.   Musculoskeletal: Normal range of motion. He exhibits no deformity.  Neurological: He is alert and oriented to person, place, and time.  Skin: Skin is warm and dry. Capillary refill takes less than 2 seconds.  Multiple old appearing linear scars to  bilateral forearms  Psychiatric: His speech is slurred. Cognition and memory are normal. He expresses impulsivity and inappropriate judgment. He expresses suicidal ideation. He expresses suicidal plans.  SI with plan to jump off bridge, slight slurred speech. Denies HI and AVH.   Nursing note and vitals reviewed.    ED Treatments / Results  Labs (all labs ordered are listed, but only abnormal results are displayed) Labs Reviewed  COMPREHENSIVE METABOLIC PANEL - Abnormal; Notable for the following components:      Result Value   Calcium 8.4 (*)    All other components within normal limits  ETHANOL - Abnormal; Notable for the following components:   Alcohol, Ethyl (B) 336 (*)    All other components within normal limits  ACETAMINOPHEN LEVEL - Abnormal; Notable for the following components:   Acetaminophen (Tylenol), Serum <10 (*)    All other components within normal limits  CBC - Abnormal; Notable for the following components:   RBC 3.87 (*)    Hemoglobin 12.1 (*)    HCT 37.2 (*)    All other components within normal limits  SALICYLATE LEVEL  RAPID URINE DRUG SCREEN, HOSP PERFORMED    EKG  EKG Interpretation None       Radiology Dg Chest 2 View  Result Date: 04/11/2017 CLINICAL DATA:  Intoxicated with cough EXAM: CHEST - 2 VIEW COMPARISON:  11/04/2016 FINDINGS: The heart size and mediastinal contours are within normal limits. Both lungs are clear. The visualized skeletal structures are unremarkable. IMPRESSION: No active cardiopulmonary disease. Electronically Signed   By: Alcide Clever M.D.   On: 04/11/2017 19:39    Procedures Procedures (including critical care time)  Medications Ordered in ED Medications  sodium chloride 0.9 % bolus 1,000 mL (1,000 mLs Intravenous New Bag/Given 04/11/17 2045)  thiamine (VITAMIN B-1) tablet 100 mg (100 mg Oral Given 04/11/17 2126)     Initial Impression / Assessment and Plan / ED Course  I have reviewed the triage vital signs and  the nursing notes.  Pertinent labs & imaging results that were available during my care of the patient were reviewed by me and considered in my medical decision making (see chart for details).    Pt here with active suicidal ideation with plan in setting of acute ETOH intoxication. History of the same. H/o previous suicidal attempts. He is homeless.  On exam, pt is holding a rational conversation, however ETOH 330s. Labs reviewed and otherwise WNL. CXR and EKG ordered as pt reported cough, however these unremarkable. Discussed ED course and upcoming psych evaluation with patient who is agreeable to stay voluntarily. Pending TTS consult. IVF and thiamine given.   Final Clinical Impressions(s) / ED Diagnoses   RN notified me TTS recommends overnight obs for AM evaluation. Re-evaluated pt, in no distress. VS WNL and stable.  Final diagnoses:  Alcoholic intoxication without complication [facility])  Suicidal thoughts    ED Discharge Orders    None       Jerrell Mylar 04/11/17 2141    Lynelle Doctor,  Cletis AthensJon, MD 04/12/17 [id number]

## 2017-04-11 NOTE — ED Triage Notes (Signed)
Per [facility]- Pt homeless. Kicked out of detox for being intoxicated. Pt reports etoh, meth, cocaine and heroin recently. Admit SI denies HI.

## 2017-04-11 NOTE — ED Notes (Signed)
[facility] Expected date:  Expected time:  Means of arrival:  Comments: [place]

## 2017-04-12 DIAGNOSIS — Y908 Blood alcohol level of 240 mg/100 ml or more: Secondary | ICD-10-CM

## 2017-04-12 DIAGNOSIS — F419 Anxiety disorder, unspecified: Secondary | ICD-10-CM

## 2017-04-12 DIAGNOSIS — R450 Nervousness: Secondary | ICD-10-CM

## 2017-04-12 DIAGNOSIS — F10129 Alcohol abuse with intoxication, unspecified: Secondary | ICD-10-CM

## 2017-04-12 DIAGNOSIS — F1994 Other psychoactive substance use, unspecified with psychoactive substance-induced mood disorder: Secondary | ICD-10-CM

## 2017-04-12 MED ORDER — CITALOPRAM HYDROBROMIDE 10 MG PO TABS
10.0000 mg | ORAL_TABLET | Freq: Every day | ORAL | Status: DC
  Administered 2017-04-12: 10 mg via ORAL
  Filled 2017-04-12: qty 1

## 2017-04-12 NOTE — ED Notes (Signed)
Pt denies SI/HI/AVH. Pt given discharge instructions. Pt states understanding. Pt states receipt of all belongings.   

## 2017-04-12 NOTE — BHH Suicide Risk Assessment (Cosign Needed)
Suicide Risk Assessment  Discharge Assessment   [facility]   Principal Problem: Substance induced mood disorder [facility](HCC) Discharge Diagnoses:  Patient Active Problem List   Diagnosis Date Noted  . Substance induced mood disorder (HCC) [F19.94] 11/03/2016  . Polysubstance abuse (HCC) [F19.10] 11/03/2016  . Suicidal ideation [R45.851]     Total Time spent with patient: 45 minutes  Musculoskeletal: Strength & Muscle Tone: within normal limits Gait & Station: normal Patient leans: N/A  Psychiatric Specialty Exam: Physical Exam  Constitutional: He is oriented to person, place, and time. He appears well-developed and well-nourished.  HENT:  Head: Normocephalic.  Respiratory: Effort normal.  Musculoskeletal: Normal range of motion.  Neurological: He is alert and oriented to person, place, and time.  Psychiatric: His speech is normal and behavior is normal. Thought content normal. His mood appears anxious. Cognition and memory are normal. He expresses impulsivity. He exhibits a depressed mood.   Review of Systems  Genitourinary: Negative for dysuria.  Psychiatric/Behavioral: Positive for depression and substance abuse. Negative for hallucinations, memory loss and suicidal ideas. The patient is nervous/anxious. The patient does not have insomnia.   All other systems reviewed and are negative.  Blood pressure 133/79, pulse 70, temperature 98 F (36.7 C), temperature source Oral, resp. rate 14, height 6\' 1"  (1.854 m), weight 54.4 kg (120 lb), SpO2 96 %.Body mass index is 15.83 kg/m. General Appearance: Disheveled Eye Contact:  Good Speech:  Clear and Coherent and Normal Rate Volume:  Normal Mood:  Anxious and Depressed Affect:  Congruent and Depressed Thought Process:  Coherent, Goal Directed and Linear Orientation:  Full (Time, Place, and Person) Thought Content:  Logical Suicidal Thoughts:  No Homicidal Thoughts:  No Memory:  Immediate;   Good Recent;    Good Remote;   Fair Judgement:  Poor Insight:  Lacking Psychomotor Activity:  Normal Concentration:  Concentration: Good and Attention Span: Good Recall:  Good Fund of Knowledge:  Good Language:  Good Akathisia:  No Handed:  Right AIMS (if indicated):    Assets:  Communication Skills Desire for Improvement ADL's:  Intact Cognition:  WNL   Mental Status Per Nursing Assessment::   On Admission:   Suicidal ideation while Intoxicated  Demographic Factors:  Male, Caucasian, Low socioeconomic status and Unemployed  Loss Factors: Financial problems/change in socioeconomic status  Historical Factors: Impulsivity  Risk Reduction Factors:   Sense of responsibility to family  Continued Clinical Symptoms:  Depression:   Impulsivity Alcohol/Substance Abuse/Dependencies  Cognitive Features That Contribute To Risk:  Closed-mindedness    Suicide Risk:  Minimal: No identifiable suicidal ideation.  Patients presenting with no risk factors but with morbid ruminations; may be classified as minimal risk based on the severity of the depressive symptoms    Plan Of Care/Follow-up recommendations:  Activity:  as tolerated Diet:  Heart healthy  Laveda AbbeLaurie Britton Theadora Noyes, NP 04/12/2017, 1:00 PM

## 2017-04-12 NOTE — BH Assessment (Signed)
[facility]  Per Juanetta BeetsJacqueline Norman, DO, this pt does not require psychiatric hospitalization at this time.  [facility].  This has been included in pt's discharge instructions.  Pt would also benefit from seeing Peer Support Specialists; they will be asked to speak to pt.  Pt's nurse, Luvenia StarchKeneasha, has been notified.  Doylene Canninghomas Jazariah Teall, MA Triage Specialist [phone]

## 2017-04-12 NOTE — Patient Outreach (Signed)
ED Peer Support Specialist Patient Intake (Complete at intake & 30-60 Day Follow-up)  Name: Donald Logan  MRN: [id number]  Age: 62 y.o.   Date of Admission: 04/12/2017  Intake: Initial Comments:      Primary Reason Admitted: The pt came in after being discharged from his alcohol use program with [facility].  The pt initially came to the emergency room last week stating he wanted detox and that he was suicidal.  He later stated he was not suicidal and and wanted to leave.  The pt was discharged. The pt later came back to the ED today  stating he wants detox from alcohol and is suicidal with a plan to jump off of a bridge or to cut his wrist. The pt reported he has cut his wrist several time and showed the counselor several scars from previous cuts.  It is unclear how many times the pt has been hospitalized for psychiatric reasons, if any.  The pt last went to an inpatient detox facility in 2018 and he reports he went [facility] [place] and [facility] [place].  He was going to [facility] in [place].  He initially stated [facility] kicked him out of their program and then he stated his roommate kicked him out of the hotel he was staying in.      Lab values: Alcohol/ETOH: Positive Positive UDS? No Amphetamines: No Barbiturates: No Benzodiazepines: No Cocaine: No Opiates: No Cannabinoids: No  Demographic information: Gender: Male Ethnicity: White Marital Status: Divorced Insurance Status: Patent attorneyMedicaid Receives non-medical governmental assistance (Work Engineer, agriculturalirst/Welfare, Sales executivefood stamps, Catering manageretc.: Yes(SSI) Lives with:   Living situation: House/Apartment  Reported Patient History: Patient reported health conditions: Depression Patient aware of HIV and hepatitis status: No  In past year, has patient visited ED for any reason? No  Number of ED visits:    Reason(s) for visit:    In past year, has patient been hospitalized for any reason? No  Number of  hospitalizations:    Reason(s) for hospitalization:    In past year, has patient been arrested? No  Number of arrests:    Reason(s) for arrest:    In past year, has patient been incarcerated? No  Number of incarcerations:    Reason(s) for incarceration:    In past year, has patient received medication-assisted treatment?    In past year, patient received the following treatments:    In past year, has patient received any harm reduction services? No  Did this include any of the following?    In past year, has patient received care from a mental health provider for diagnosis other than SUD? No  In past year, is this first time patient has overdosed? No  Number of past overdoses:    In past year, is this first time patient has been hospitalized for an overdose? No  Number of hospitalizations for overdose(s):    Is patient currently receiving treatment for a mental health diagnosis? No  Patient reports experiencing difficulty participating in SUD treatment: No    Most important reason(s) for this difficulty?    Has patient received prior services for treatment? No  In past, patient has received services from following agencies:    Plan of Care:  Suggested follow up at these agencies/treatment centers: [facility]  Other information: CPSS John and Myself spoke with Pt to see what concerns or issues Pt wants to address. CPSS talked with Pt  about what pathway to recovery he may want to work towards, CPSS gave Pt a few options that maybe beneficial for Pt. CPSS John addressed the fact that Pt maybe able to get into [facility] facility in [place]. CPSS made Pt aware that we will follow up with Pt after hearing from the facility.      Arlys John Miamor Ayler, CPSS  04/12/2017 12:03 PM

## 2017-04-12 NOTE — Consult Note (Addendum)
[name]   Reason for Consult:  Alcohol intoxication Referring Physician:  EDP Patient Identification: Donald Logan MRN:  [id number] Principal Diagnosis: Substance induced mood disorder ([place]) Diagnosis:   Patient Active Problem List   Diagnosis Date Noted  . Substance induced mood disorder ([place]) [F19.94] 11/03/2016  . Polysubstance abuse ([place]) [F19.10] 11/03/2016  . Suicidal ideation [R45.851]     Total Time spent with patient: 45 minutes  Subjective:   Donald Logan is a 62 y.o. male patient admitted with alcohol intoxication and asking for detox  HPI:  Pt was seen and chart reviewed with treatment team and Dr Mariea Clonts. Pt stated he was sober for 11.5 years but for the past year he has been drinking 1-1.5 cases of beer daily. Pt is requesting help with detox. Pt will be seen by peer support for community substance abuse treatment options. Pt's BAL was 336 and UDS negative. Pt denies suicidal/homicidal ideation, denies auditory/visual hallucinations and does not appear to be responding to internal stimuli. Pt is psychiatrically clear for discharge.    Past Psychiatric History: As above  Risk to Self: Denies SI.  Risk to Others: Homicidal Ideation: No Thoughts of Harm to Others: No Current Homicidal Intent: No Current Homicidal Plan: No Access to Homicidal Means: No Identified Victim: NA History of harm to others?: No Assessment of Violence: None Noted Violent Behavior Description: none Does patient have access to weapons?: No Criminal Charges Pending?: No Does patient have a court date: No Prior Inpatient Therapy: Prior Inpatient Therapy: Yes Prior Therapy Dates: 2018 and several others Prior Therapy Facilty/Provider(s): [facility] Reason for Treatment: alcohol use Prior Outpatient Therapy: Prior Outpatient Therapy: Yes Prior Therapy Dates: 04/2017 Prior Therapy Facilty/Provider([facility]): [facility] Reason for Treatment: alcohol use Does  patient have an ACCT team?: No Does patient have Intensive In-House Services?  : No Does patient have [facility] services? : No Does patient have P4CC services?: No  Past Medical History:  Past Medical History:  Diagnosis Date  . Peptic ulcer   . Polysubstance (excluding opioids) dependence ([place])   . Substance abuse ([place])   . Ulcer of abdomen wall ([place])    History reviewed. No pertinent surgical history. Family History: No family history on file. Family Psychiatric  History: Unknown Social History:  Social History   Substance and Sexual Activity  Alcohol Use Yes   Comment: sts at least 6 cans of beer daily- but looses count      Social History   Substance and Sexual Activity  Drug Use Yes  . Types: Cocaine, Methamphetamines    Social History   Socioeconomic History  . Marital status: Single    Spouse name: None  . Number of children: None  . Years of education: None  . Highest education level: None  Social Needs  . Financial resource strain: None  . Food insecurity - worry: None  . Food insecurity - inability: None  . Transportation needs - medical: None  . Transportation needs - non-medical: None  Occupational History  . None  Tobacco Use  . Smoking status: Never Smoker  . Smokeless tobacco: Never Used  Substance and Sexual Activity  . Alcohol use: Yes    Comment: sts at least 6 cans of beer daily- but looses count   . Drug use: Yes    Types: Cocaine, Methamphetamines  . Sexual activity: None  Other Topics Concern  . None  Social History Narrative  . None   Additional Social History:  N/A    Allergies:   Allergies  Allergen Reactions  . Codeine Nausea And Vomiting    Labs:  Results for orders placed or performed during the hospital encounter of 04/11/17 (from the past 48 hour(s))  Rapid urine drug screen (hospital performed)     Status: None   Collection Time: 04/11/17  6:33 PM  Result Value Ref Range   Opiates NONE DETECTED NONE DETECTED    Cocaine NONE DETECTED NONE DETECTED   Benzodiazepines NONE DETECTED NONE DETECTED   Amphetamines NONE DETECTED NONE DETECTED   Tetrahydrocannabinol NONE DETECTED NONE DETECTED   Barbiturates NONE DETECTED NONE DETECTED    Comment: (NOTE) DRUG SCREEN FOR MEDICAL PURPOSES ONLY.  IF CONFIRMATION IS NEEDED FOR ANY PURPOSE, NOTIFY LAB WITHIN 5 DAYS. LOWEST DETECTABLE LIMITS FOR URINE DRUG SCREEN Drug Class                     Cutoff (ng/mL) Amphetamine and metabolites    1000 Barbiturate and metabolites    200 Benzodiazepine                 314 Tricyclics and metabolites     300 Opiates and metabolites        300 Cocaine and metabolites        300 THC                            50 Performed at [facility], [place] [address], [place], [place] [address]   Comprehensive metabolic panel     Status: Abnormal   Collection Time: 04/11/17  6:36 PM  Result Value Ref Range   Sodium 142 135 - 145 mmol/L   Potassium 3.7 3.5 - 5.1 mmol/L   Chloride 110 101 - 111 mmol/L   CO2 23 22 - 32 mmol/L   Glucose, Bld 93 65 - 99 mg/dL   BUN 9 6 - 20 mg/dL   Creatinine, Ser 0.82 0.61 - 1.24 mg/dL   Calcium 8.4 (L) 8.9 - 10.3 mg/dL   Total Protein 7.3 6.5 - 8.1 g/dL   Albumin 4.2 3.5 - 5.0 g/dL   AST 26 15 - 41 U/L   ALT 19 17 - 63 U/L   Alkaline Phosphatase 40 38 - 126 U/L   Total Bilirubin 0.4 0.3 - 1.2 mg/dL   GFR calc non Af Amer >60 >60 mL/min   GFR calc Af Amer >60 >60 mL/min    Comment: (NOTE) The eGFR has been calculated using the CKD EPI equation. This calculation has not been validated in all clinical situations. eGFR's persistently <60 mL/min signify possible Chronic Kidney Disease.    Anion gap 9 5 - 15    Comment: Performed at [facility], [place] [address] [place], [place] [address]  Ethanol     Status: Abnormal   Collection Time: 04/11/17  6:36 PM  Result Value Ref Range   Alcohol, Ethyl (B) 336 (HH) <10 mg/dL    Comment:        LOWEST  DETECTABLE LIMIT FOR SERUM ALCOHOL IS 10 mg/dL FOR MEDICAL PURPOSES ONLY CRITICAL RESULT CALLED TO, READ BACK BY AND VERIFIED WITH: L.COLES AT 1940 ON 04/11/17 BY N.THOMPSON Performed at [facility], [place] [address], [place], [place] [address]   Salicylate level     Status: None   Collection Time: 04/11/17  6:36 PM  Result Value Ref Range   Salicylate Lvl <  7.0 2.8 - 30.0 mg/dL    Comment: Performed at [facility], [place] [address], [place], [place] [address]  Acetaminophen level     Status: Abnormal   Collection Time: 04/11/17  6:36 PM  Result Value Ref Range   Acetaminophen (Tylenol), Serum <10 (L) 10 - 30 ug/mL    Comment:        THERAPEUTIC CONCENTRATIONS VARY SIGNIFICANTLY. A RANGE OF 10-30 ug/mL MAY BE AN EFFECTIVE CONCENTRATION FOR MANY PATIENTS. HOWEVER, SOME ARE BEST TREATED AT CONCENTRATIONS OUTSIDE THIS RANGE. ACETAMINOPHEN CONCENTRATIONS >150 ug/mL AT 4 HOURS AFTER INGESTION AND >50 ug/mL AT 12 HOURS AFTER INGESTION ARE OFTEN ASSOCIATED WITH TOXIC REACTIONS. Performed at [facility], [place] [address], [place], [place] [address]   cbc     Status: Abnormal   Collection Time: 04/11/17  6:36 PM  Result Value Ref Range   WBC 4.5 4.0 - 10.5 K/uL   RBC 3.87 (L) 4.22 - 5.81 MIL/uL   Hemoglobin 12.1 (L) 13.0 - 17.0 g/dL   HCT 37.2 (L) 39.0 - 52.0 %   MCV 96.1 78.0 - 100.0 fL   MCH 31.3 26.0 - 34.0 pg   MCHC 32.5 30.0 - 36.0 g/dL   RDW 14.6 11.5 - 15.5 %   Platelets 248 150 - 400 K/uL    Comment: Performed at [facility], [place] [address] [place], [place] [address]    No current facility-administered medications for this encounter.    Current Outpatient Medications  Medication Sig Dispense Refill  . Calcium Carbonate Antacid (MAALOX) 600 MG chewable tablet Chew 1 tablet (600 mg total) by mouth every 4 (four) hours as needed for heartburn. (Patient not taking: Reported on 04/11/2017) 90 tablet 0   . ondansetron (ZOFRAN ODT) 8 MG disintegrating tablet Take 1 tablet (8 mg total) by mouth every 8 (eight) hours as needed for nausea or vomiting. (Patient not taking: Reported on 04/11/2017) 10 tablet 0  . ranitidine (ZANTAC) 150 MG tablet Take 1 tablet (150 mg total) by mouth 2 (two) times daily. (Patient not taking: Reported on 04/11/2017) 60 tablet 0    Musculoskeletal: Strength & Muscle Tone: within normal limits Gait & Station: normal Patient leans: N/A  Psychiatric Specialty Exam: Physical Exam  Nursing note and vitals reviewed. Constitutional: He is oriented to person, place, and time. He appears well-developed and well-nourished.  HENT:  Head: Normocephalic and atraumatic.  Neck: Normal range of motion.  Respiratory: Effort normal.  Musculoskeletal: Normal range of motion.  Neurological: He is alert and oriented to person, place, and time.  Psychiatric: His speech is normal and behavior is normal. Thought content normal. His mood appears anxious. Cognition and memory are normal. He expresses impulsivity. He exhibits a depressed mood.    Review of Systems  Genitourinary: Negative for dysuria.  Psychiatric/Behavioral: Positive for depression and substance abuse. Negative for hallucinations, memory loss and suicidal ideas. The patient is nervous/anxious. The patient does not have insomnia.   All other systems reviewed and are negative.   Blood pressure 133/79, pulse 70, temperature 98 F (36.7 C), temperature source Oral, resp. rate 14, height _0  (1.854 m), weight 54.4 kg (120 lb), SpO2 96 %.Body mass index is 15.83 kg/m.  General Appearance: Disheveled  Eye Contact:  Good  Speech:  Clear and Coherent and Normal Rate  Volume:  Normal  Mood:  Anxious and Depressed  Affect:  Congruent and Depressed  Thought Process:  Coherent, Goal Directed and Linear  Orientation:  Full (Time, Place, and Person)  Thought Content:  Logical  Suicidal Thoughts:  No  Homicidal Thoughts:  No   Memory:  Immediate;   Good Recent;   Good Remote;   Fair  Judgement:  Poor  Insight:  Lacking  Psychomotor Activity:  Normal  Concentration:  Concentration: Good and Attention Span: Good  Recall:  Good  Fund of Knowledge:  Good  Language:  Good  Akathisia:  No  Handed:  Right  AIMS (if indicated):   N/A  Assets:  Communication Skills Desire for Improvement  ADL's:  Intact  Cognition:  WNL  Sleep:   N/A     Treatment Plan Summary: Plan Substance induced mood disorder ([place])  Discharge home Follow up with Peer Support in the community for substance abuse treatment options.  Follow up with [facility] for therapy and medication management Take all medications as prescribed Avoid the use of alcohol and illicit drugs  Disposition: No evidence of imminent risk to self or others at present.   Patient does not meet criteria for psychiatric inpatient admission. Supportive therapy provided about ongoing stressors. Discussed crisis plan, support from social network, calling 911, coming to the Emergency Department, and calling Suicide Hotline.  Ethelene Hal, NP 04/12/2017 12:57 PM   Patient seen face-to-face for psychiatric evaluation, chart reviewed and case discussed with the physician extender and developed treatment plan. Reviewed the information documented and agree with the treatment plan.  Buford Dresser, DO 04/12/17 6:12 PM

## 2017-04-12 NOTE — Discharge Instructions (Signed)
For your behavioral health needs, you are advised to follow up with [facility].  Contact them at your earliest opportunity to ask about scheduling an intake appointment: ° °     [facility] °     [address] °     [place], [place] [address] °     [phone] °

## 2017-08-14 ENCOUNTER — Emergency Department (HOSPITAL_COMMUNITY): Admission: EM | Admit: 2017-08-14 | Discharge: 2017-09-01 | Disposition: E | Payer: Medicaid Other

## 2017-09-01 NOTE — ED Notes (Signed)
[facility] Patient [facility]

## 2017-09-01 DEATH — deceased

## 2018-06-09 IMAGING — CR DG CHEST 2V
2 series · 2 of 2 positions shown · non-contrast
Comparison: 11/04/2016

CLINICAL DATA: Intoxicated with cough

EXAM:
CHEST - 2 VIEW

[w chest pa]
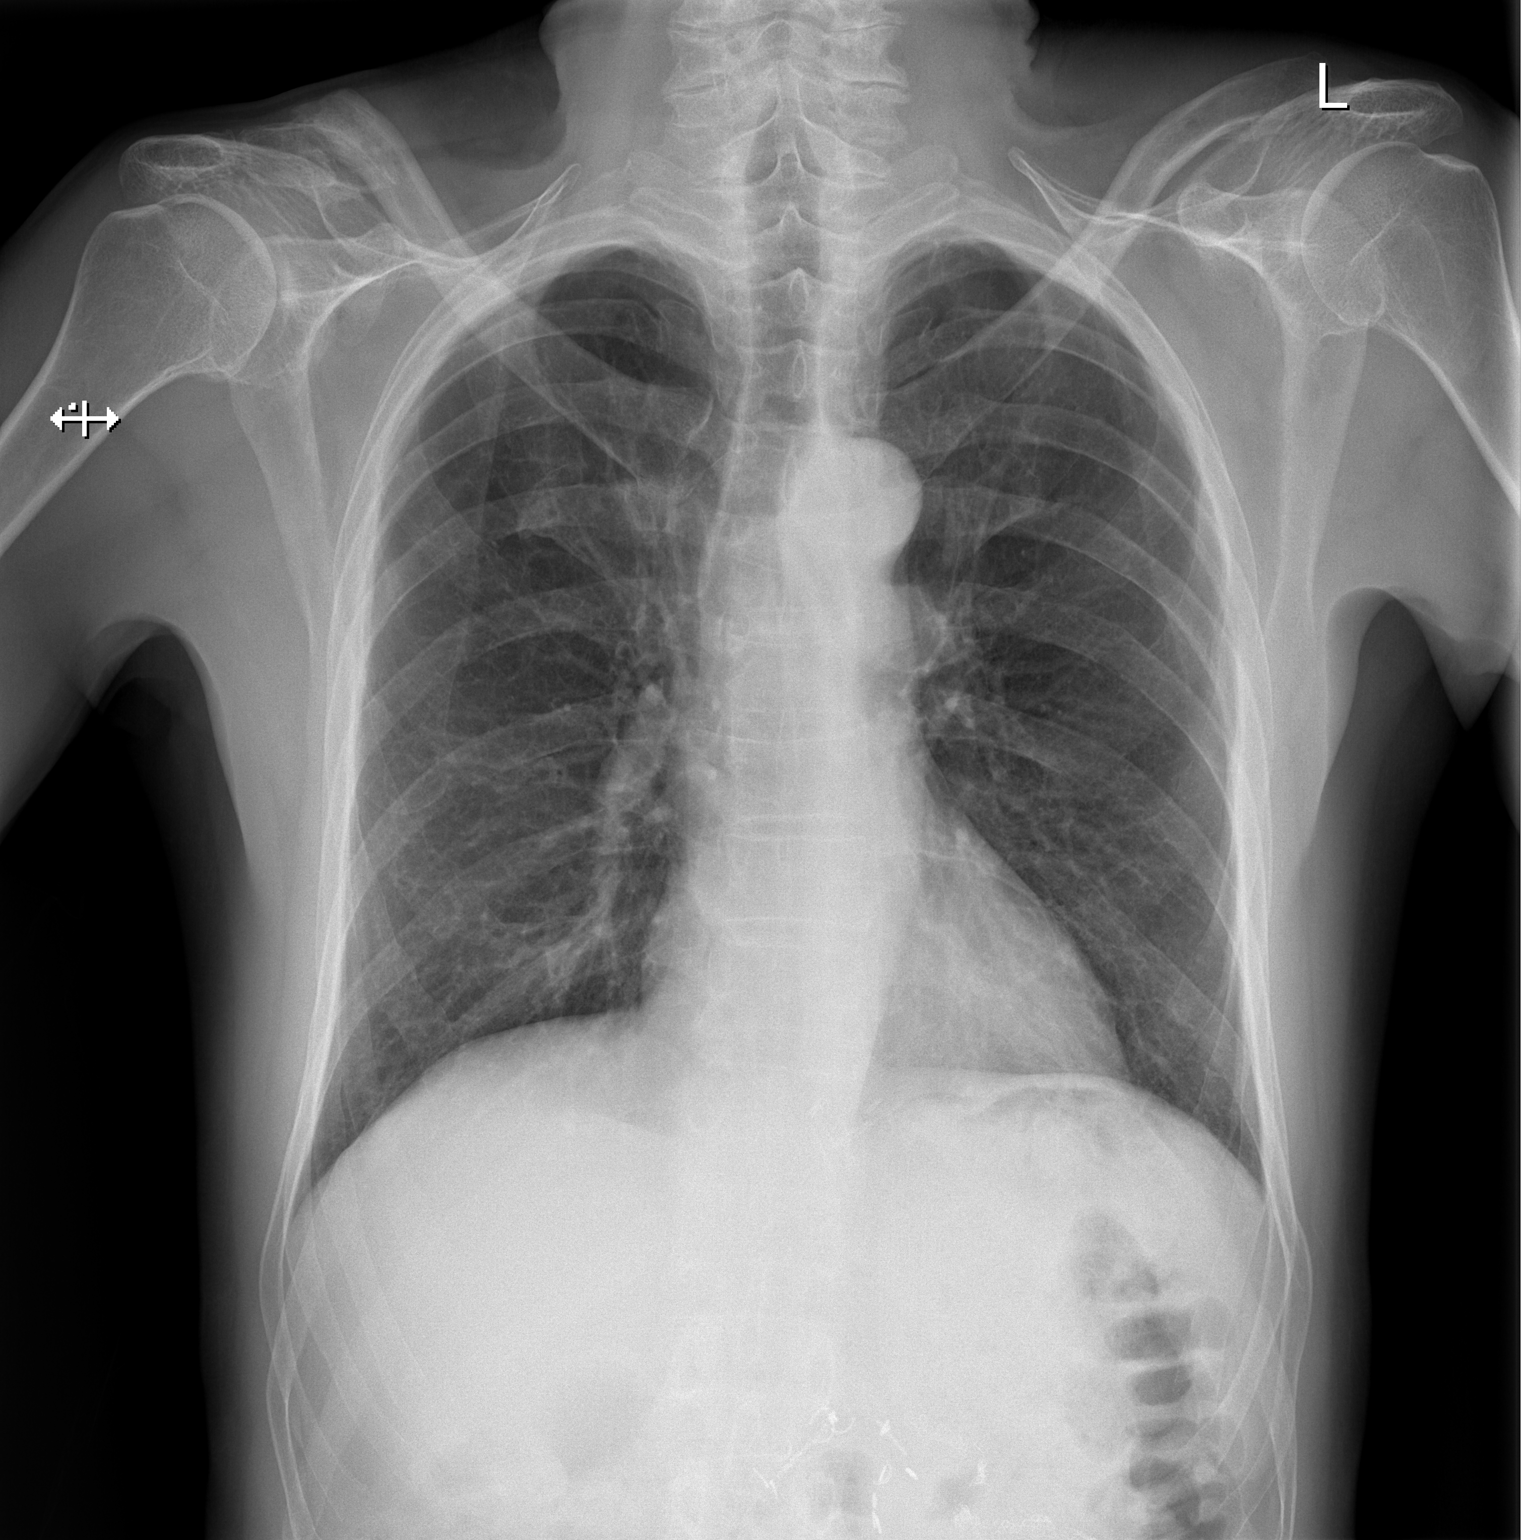

[w chest lat]
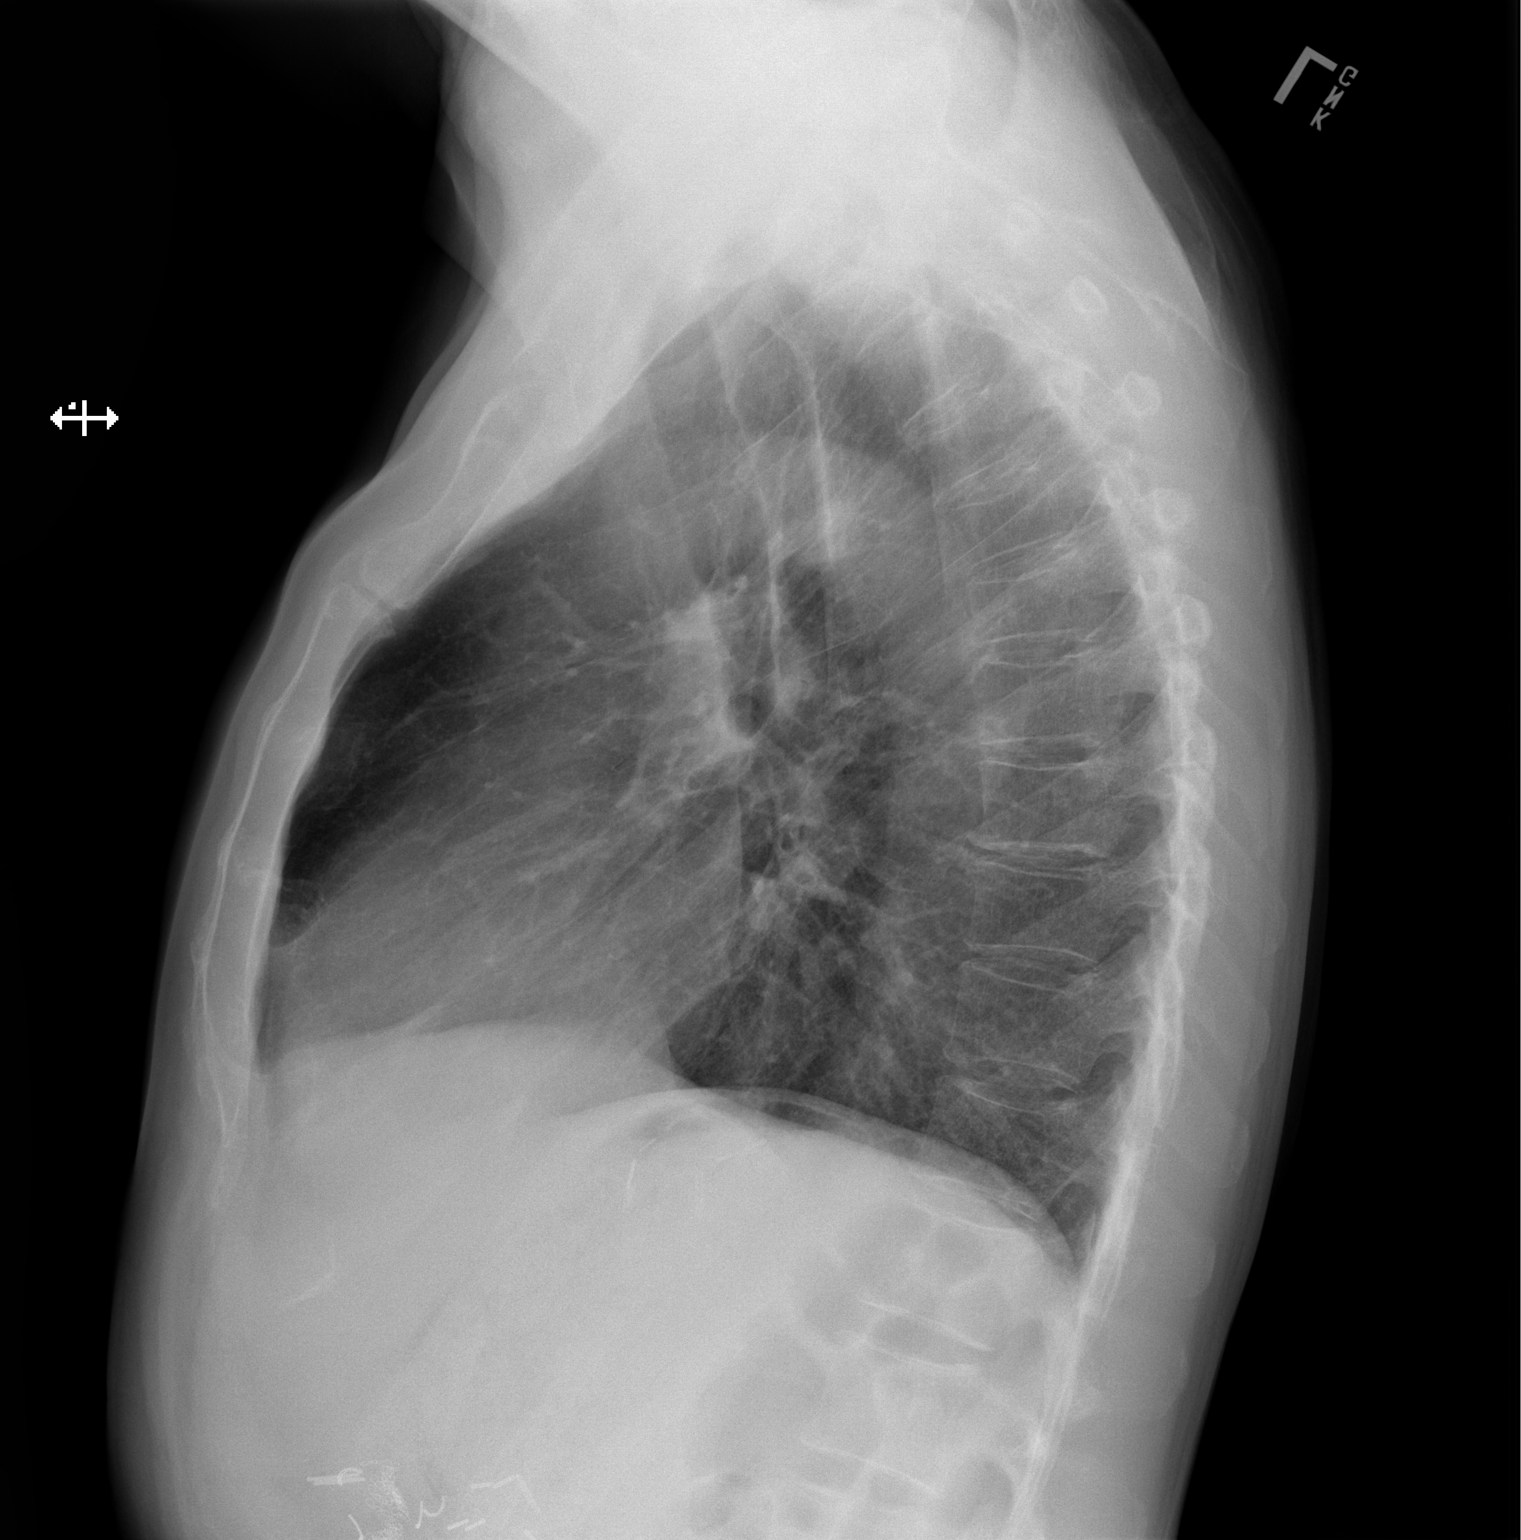

[2 of 2 positions shown; findings below may reference images not displayed]

FINDINGS: The heart size and mediastinal contours are within normal limits.
Both lungs are clear. The visualized skeletal structures are
unremarkable.
IMPRESSION: No active cardiopulmonary disease.
